# Patient Record
Sex: Male | Born: 1995 | Race: Black or African American | Hispanic: No | Marital: Single | Smoking: Current every day smoker
Health system: Southern US, Community
[De-identification: ages and names within clinical notes are randomized; demographics above are authoritative.]

## PROBLEM LIST (undated history)

## (undated) ENCOUNTER — Emergency Department (HOSPITAL_COMMUNITY): Payer: Medicaid Other | Source: Home / Self Care

## (undated) DIAGNOSIS — J45909 Unspecified asthma, uncomplicated: Secondary | ICD-10-CM

---
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
## 2017-06-25 ENCOUNTER — Other Ambulatory Visit: Payer: Self-pay

## 2017-06-25 ENCOUNTER — Encounter (HOSPITAL_BASED_OUTPATIENT_CLINIC_OR_DEPARTMENT_OTHER): Payer: Self-pay

## 2017-06-25 DIAGNOSIS — R197 Diarrhea, unspecified: Secondary | ICD-10-CM | POA: Insufficient documentation

## 2017-06-25 NOTE — ED Triage Notes (Signed)
Pt c/o an episode of diarrhea at 1700 with continued nausea since then, has been somewhat relieved by pepto bismol

## 2017-06-26 ENCOUNTER — Emergency Department (HOSPITAL_BASED_OUTPATIENT_CLINIC_OR_DEPARTMENT_OTHER)
Admission: EM | Admit: 2017-06-26 | Discharge: 2017-06-26 | Disposition: A | Discharge status: Home / Self Care | Payer: Medicaid Other | Attending: Emergency Medicine | Admitting: Emergency Medicine

## 2017-06-26 DIAGNOSIS — R197 Diarrhea, unspecified: Secondary | ICD-10-CM

## 2017-06-26 MED ORDER — LOPERAMIDE HCL 2 MG PO CAPS: 2.0000 mg | ORAL_CAPSULE | Freq: Four times a day (QID) | ORAL | 0 refills | Status: AC | PRN

## 2017-06-26 NOTE — ED Notes (Signed)
Pt had diarrhea x 1 at 1700 last pm  Nothing since

## 2017-06-26 NOTE — Discharge Instructions (Signed)
Please read and follow all provided instructions.  Your diagnoses today include:  1. Diarrhea in adult patient     Tests performed today include: Vital signs. See below for your results today.   Medications prescribed:  Take as prescribed   Home care instructions:  Follow any educational materials contained in this packet.  Follow-up instructions: Please follow-up with your primary care provider for further evaluation of symptoms and treatment   Return instructions:  Please return to the Emergency Department if you do not get better, if you get worse, or new symptoms OR  - Fever (temperature greater than 101.59F)  - Bleeding that does not stop with holding pressure to the area    -Severe pain (please note that you may be more sore the day after your accident)  - Chest Pain  - Difficulty breathing  - Severe nausea or vomiting  - Inability to tolerate food and liquids  - Passing out  - Skin becoming red around your wounds  - Change in mental status (confusion or lethargy)  - New numbness or weakness    Please return if you have any other emergent concerns.  Additional Information:  Your vital signs today were: BP (!) 142/84 (BP Location: Left Arm)    Pulse 78    Temp 99.9 F (37.7 C) (Oral)    Resp 18    Ht 5\' 9"  (1.753 m)    Wt 95.3 kg (210 lb)    SpO2 100%    BMI 31.01 kg/m  If your blood pressure (BP) was elevated above 135/85 this visit, please have this repeated by your doctor within one month. ---------------

## 2017-06-26 NOTE — ED Provider Notes (Signed)
[facility] EMERGENCY DEPARTMENT Provider Note   CSN: [id number] Arrival date & time: 06/25/17  2305     History   Chief Complaint Chief Complaint  Patient presents with  . Diarrhea    HPI Paul Reilly is a 21 y.o. male.  HPI  21 y.o. male with a hx of Asthma, presents to the Emergency Department today due to diarrhea. This occurred around 1700. Resolved afterwards. Noted nausea without emesis. This also resolved. No abdominal pain. States symptoms occurred after eating waffle house. No CP/SOB. No fevers. No headaches. Denies symptoms currently. Took peptobismol with improvement of symptoms. No other symptoms noted   Past Medical History:  Diagnosis Date  . Asthma     There are no active problems to display for this patient.   History reviewed. No pertinent surgical history.     Home Medications    Prior to Admission medications   Not on File    Family History No family history on file.  Social History Social History   Tobacco Use  . Smoking status: Never Smoker  Substance Use Topics  . Alcohol use: No  . Drug use: No     Allergies   Patient has no known allergies.   Review of Systems Review of Systems ROS reviewed and all are negative for acute change except as noted in the HPI.  Physical Exam Updated Vital Signs BP (!) 142/84 (BP Location: Left Arm)   Pulse 78   Temp 99.9 F (37.7 C) (Oral)   Resp 18   Ht 5\' 9"  (1.753 m)   Wt 95.3 kg (210 lb)   SpO2 100%   BMI 31.01 kg/m   Physical Exam  Constitutional: He is oriented to person, place, and time. He appears well-developed and well-nourished. No distress.  HENT:  Head: Normocephalic and atraumatic.  Right Ear: Tympanic membrane, external ear and ear canal normal.  Left Ear: Tympanic membrane, external ear and ear canal normal.  Nose: Nose normal.  Mouth/Throat: Uvula is midline, oropharynx is clear and moist and mucous membranes are normal. No trismus in the jaw. No  oropharyngeal exudate, posterior oropharyngeal erythema or tonsillar abscesses.  Eyes: EOM are normal. Pupils are equal, round, and reactive to light.  Neck: Normal range of motion. Neck supple. No tracheal deviation present.  Cardiovascular: Normal rate, regular rhythm, S1 normal, S2 normal, normal heart sounds, intact distal pulses and normal pulses.  Pulmonary/Chest: Effort normal and breath sounds normal. No respiratory distress. He has no decreased breath sounds. He has no wheezes. He has no rhonchi. He has no rales.  Abdominal: Normal appearance and bowel sounds are normal. There is no tenderness. There is no rigidity, no rebound, no guarding, no CVA tenderness, no tenderness at McBurney's point and negative Murphy's sign.  Abdomen soft  Musculoskeletal: Normal range of motion.  Neurological: He is alert and oriented to person, place, and time.  Skin: Skin is warm and dry.  Psychiatric: He has a normal mood and affect. His speech is normal and behavior is normal. Thought content normal.  Nursing note and vitals reviewed.    ED Treatments / Results  Labs (all labs ordered are listed, but only abnormal results are displayed) Labs Reviewed - No data to display  EKG  EKG Interpretation None       Radiology No results found.  Procedures Procedures (including critical care time)  Medications Ordered in ED Medications - No data to display   Initial Impression / Assessment and Plan /  ED Course  I have reviewed the triage vital signs and the nursing notes.  Pertinent labs & imaging results that were available during my care of the patient were reviewed by me and considered in my medical decision making (see chart for details).  Final Clinical Impressions(s) / ED Diagnoses     {I have reviewed the relevant previous healthcare records.  {I obtained HPI from historian.   ED Course:  Assessment: Pt is a 21 y.o. male  with a hx of Asthma, presents to the Emergency Department  today due to diarrhea. This occurred around 1700. Resolved afterwards. Noted nausea without emesis. This also resolved. No abdominal pain. States symptoms occurred after eating waffle house. No CP/SOB. No fevers. No headaches. Denies symptoms currently. Took peptobismol with improvement of symptoms. On exam, pt in NAD. Nontoxic/nonseptic appearing. VSS. Afebrile. Lungs CTA. Heart RRR. Abdomen nontender soft. Likely viral. No evidence of dehydration. Plan is to DC home with follow up to PCP. At time of discharge, Patient is in no acute distress. Vital Signs are stable. Patient is able to ambulate. Patient able to tolerate PO.   Disposition/Plan:  DC Home Additional Verbal discharge instructions given and discussed with patient.  Pt Instructed to f/u with PCP in the next week for evaluation and treatment of symptoms. Return precautions given Pt acknowledges and agrees with plan  Supervising Physician [place], Jonny RuizJohn, MD  Final diagnoses:  Diarrhea in adult patient    ED Discharge Orders    None       Audry PiliMohr, Sapna Padron, PA-C 06/26/17 [place] [id number]    Pfeiffer, Marcy, MD 06/27/17 [phone]

## 2017-11-09 ENCOUNTER — Encounter (HOSPITAL_BASED_OUTPATIENT_CLINIC_OR_DEPARTMENT_OTHER): Payer: Self-pay | Admitting: *Deleted

## 2017-11-09 ENCOUNTER — Emergency Department (HOSPITAL_BASED_OUTPATIENT_CLINIC_OR_DEPARTMENT_OTHER)
Admission: EM | Admit: 2017-11-09 | Discharge: 2017-11-09 | Disposition: A | Discharge status: Home / Self Care | Payer: Medicaid Other | Attending: Emergency Medicine | Admitting: Emergency Medicine

## 2017-11-09 ENCOUNTER — Other Ambulatory Visit: Payer: Self-pay

## 2017-11-09 ENCOUNTER — Emergency Department (HOSPITAL_BASED_OUTPATIENT_CLINIC_OR_DEPARTMENT_OTHER): Payer: Medicaid Other

## 2017-11-09 DIAGNOSIS — R05 Cough: Secondary | ICD-10-CM | POA: Diagnosis present

## 2017-11-09 DIAGNOSIS — R062 Wheezing: Secondary | ICD-10-CM | POA: Diagnosis not present

## 2017-11-09 DIAGNOSIS — J45901 Unspecified asthma with (acute) exacerbation: Secondary | ICD-10-CM | POA: Diagnosis not present

## 2017-11-09 DIAGNOSIS — J45909 Unspecified asthma, uncomplicated: Secondary | ICD-10-CM | POA: Insufficient documentation

## 2017-11-09 DIAGNOSIS — J069 Acute upper respiratory infection, unspecified: Secondary | ICD-10-CM | POA: Diagnosis not present

## 2017-11-09 DIAGNOSIS — B9789 Other viral agents as the cause of diseases classified elsewhere: Secondary | ICD-10-CM | POA: Diagnosis not present

## 2017-11-09 DIAGNOSIS — R0981 Nasal congestion: Secondary | ICD-10-CM | POA: Diagnosis not present

## 2017-11-09 MED ORDER — PREDNISONE 50 MG PO TABS
60.0000 mg | ORAL_TABLET | Freq: Once | ORAL | Status: AC
  Administered 2017-11-09: 60 mg via ORAL
  Filled 2017-11-09: qty 1

## 2017-11-09 MED ORDER — ALBUTEROL SULFATE HFA 108 (90 BASE) MCG/ACT IN AERS
2.0000 | INHALATION_SPRAY | Freq: Once | RESPIRATORY_TRACT | Status: AC
  Administered 2017-11-09: 2 via RESPIRATORY_TRACT
  Filled 2017-11-09: qty 6.7

## 2017-11-09 MED ORDER — PREDNISONE 20 MG PO TABS: 60.0000 mg | ORAL_TABLET | Freq: Every day | ORAL | 0 refills | Status: AC

## 2017-11-09 MED ORDER — ALBUTEROL SULFATE (2.5 MG/3ML) 0.083% IN NEBU
5.0000 mg | INHALATION_SOLUTION | Freq: Once | RESPIRATORY_TRACT | Status: AC
Start: 2017-11-09 — End: 2017-11-09
  Administered 2017-11-09: 5 mg via RESPIRATORY_TRACT
  Filled 2017-11-09: qty 6

## 2017-11-09 NOTE — ED Notes (Signed)
Pt given d/c instructions as per chart. Rx x 1. Verbalizes understanding. No questions. 

## 2017-11-09 NOTE — ED Triage Notes (Signed)
Pt states he had to walk out in the rain the other day d/t having a flat tire and has been congested since. Also c/o NP cough. BBS-few scattered rhonchi.

## 2017-11-09 NOTE — ED Notes (Signed)
ED Provider at bedside. 

## 2017-11-09 NOTE — ED Provider Notes (Signed)
TIME SEEN: 5:00 AM  CHIEF COMPLAINT: Cough, congestion  HPI: Patient is a 22 year old male with history of asthma who presents to the emergency department with complaints of nonproductive cough, nasal congestion for the past several days.  Has had intermittent mild wheezing.  No fever.  No vomiting or diarrhea.  Multiple sick contacts.  ROS: See HPI Constitutional: no fever  Eyes: no drainage  ENT: no runny nose   Cardiovascular:  no chest pain  Resp: no SOB  GI: no vomiting GU: no dysuria Integumentary: no rash  Allergy: no hives  Musculoskeletal: no leg swelling  Neurological: no slurred speech ROS otherwise negative  PAST MEDICAL HISTORY/PAST SURGICAL HISTORY:  Past Medical History:  Diagnosis Date  . Asthma     MEDICATIONS:  Prior to Admission medications   Medication Sig Start Date End Date Taking? Authorizing Provider  ALBUTEROL IN Inhale into the lungs.   Yes [provider]  loperamide (IMODIUM) 2 MG capsule Take 1 capsule (2 mg total) by mouth 4 (four) times daily as needed for diarrhea or loose stools. 06/26/17   Audry Pili, PA-C    ALLERGIES:  No Known Allergies  SOCIAL HISTORY:  Social History   Tobacco Use  . Smoking status: Never Smoker  . Smokeless tobacco: Never Used  Substance Use Topics  . Alcohol use: No    FAMILY HISTORY: History reviewed. No pertinent family history.  EXAM: BP 120/75 (BP Location: Right Arm)   Pulse 90   Temp 98.7 F (37.1 C) (Oral)   Resp 18   Ht  (1.753 m)   Wt 90.7 kg (200 lb)   SpO2 95%   BMI 29.53 kg/m  CONSTITUTIONAL: Alert and oriented and responds appropriately to questions. Well-appearing; well-nourished HEAD: Normocephalic EYES: Conjunctivae clear, pupils appear equal, EOMI ENT: normal nose; moist mucous membranes NECK: Supple, no meningismus, no nuchal rigidity, no LAD  CARD: RRR; S1 and S2 appreciated; no murmurs, no clicks, no rubs, no gallops RESP: Normal chest excursion without  splinting or tachypnea; breath sounds equal bilaterally but there are mild scattered expiratory wheezes, no rhonchi, no rales, no hypoxia or respiratory distress, speaking full sentences ABD/GI: Normal bowel sounds; non-distended; soft, non-tender, no rebound, no guarding, no peritoneal signs, no hepatosplenomegaly BACK:  The back appears normal and is non-tender to palpation, there is no CVA tenderness EXT: Normal ROM in all joints; non-tender to palpation; no edema; normal capillary refill; no cyanosis, no calf tenderness or swelling    SKIN: Normal color for age and race; warm; no rash NEURO: Moves all extremities equally PSYCH: The patient's mood and manner are appropriate. Grooming and personal hygiene are appropriate.  MEDICAL DECISION MAKING: Patient here with likely viral illness, asthma exacerbation.  Will treat symptomatically with albuterol and prednisone.  Will provide with inhaler from the emergency department.  Will obtain chest x-ray to rule out pneumonia.  Doubt pneumothorax.  Does not appear volume overloaded.  ED PROGRESS: Lungs now clear after albuterol treatment.  Reports feeling much better.  Chest x-ray clear with no sign of pneumonia, edema.  Patient does not need to be on antibiotics at this time.  Provided with albuterol inhaler and instructions on how to use it at home.  Will discharge with prednisone burst.  He states he has a primary care physician for follow-up.  Discussed return precautions.   At this time, I do not feel there is any life-threatening condition present. I have reviewed and discussed all results (EKG, imaging, lab, urine  as appropriate) and exam findings with patient/family. I have reviewed nursing notes and appropriate previous records.  I feel the patient is safe to be discharged home without further emergent workup and can continue workup as an outpatient as needed. Discussed usual and customary return precautions. Patient/family verbalize understanding  and are comfortable with this plan.  Outpatient follow-up has been provided if needed. All questions have been answered.      Dorsey Charette, Layla Maw, DO 11/09/17 [phone]

## 2017-11-09 NOTE — ED Notes (Signed)
Pt triaged by this RN 

## 2017-11-09 NOTE — Discharge Instructions (Addendum)
You may use your albuterol inhaler 2 to 4 puffs every 2-4 hours as needed for shortness of breath, wheezing.  You may use over-the-counter Mucinex as needed for cough.  If you develop fever or pain you may alternate between Tylenol 1000 mg every 6 hours and ibuprofen 800 mg every 8 hours.  I recommend that you take ibuprofen with food.  Your chest x-ray today was normal.  You have no sign of pneumonia.  You do not need to be on antibiotics.

## 2019-03-25 ENCOUNTER — Encounter (HOSPITAL_BASED_OUTPATIENT_CLINIC_OR_DEPARTMENT_OTHER): Payer: Self-pay

## 2019-03-25 ENCOUNTER — Other Ambulatory Visit: Payer: Self-pay

## 2019-03-25 ENCOUNTER — Emergency Department (HOSPITAL_BASED_OUTPATIENT_CLINIC_OR_DEPARTMENT_OTHER)
Admission: EM | Admit: 2019-03-25 | Discharge: 2019-03-25 | Disposition: A | Discharge status: Home / Self Care | Payer: Medicaid Other | Attending: Emergency Medicine | Admitting: Emergency Medicine

## 2019-03-25 ENCOUNTER — Emergency Department (HOSPITAL_BASED_OUTPATIENT_CLINIC_OR_DEPARTMENT_OTHER): Payer: Medicaid Other

## 2019-03-25 DIAGNOSIS — J069 Acute upper respiratory infection, unspecified: Secondary | ICD-10-CM

## 2019-03-25 DIAGNOSIS — F17290 Nicotine dependence, other tobacco product, uncomplicated: Secondary | ICD-10-CM | POA: Insufficient documentation

## 2019-03-25 DIAGNOSIS — J45909 Unspecified asthma, uncomplicated: Secondary | ICD-10-CM | POA: Insufficient documentation

## 2019-03-25 DIAGNOSIS — Z20828 Contact with and (suspected) exposure to other viral communicable diseases: Secondary | ICD-10-CM | POA: Insufficient documentation

## 2019-03-25 DIAGNOSIS — Z20822 Contact with and (suspected) exposure to covid-19: Secondary | ICD-10-CM

## 2019-03-25 DIAGNOSIS — Z79899 Other long term (current) drug therapy: Secondary | ICD-10-CM | POA: Insufficient documentation

## 2019-03-25 NOTE — Discharge Instructions (Signed)
You were seen in the emergency department for 2 days of cough sore throat and low-grade fever.  Your chest x-ray did not show any signs of pneumonia.  This is possibly Covid and so you should isolate until the results of your test are back and your symptoms are improved.  Please follow-up with your doctor return if any worsening symptoms.

## 2019-03-25 NOTE — ED Provider Notes (Signed)
[place] EMERGENCY DEPARTMENT Provider Note   CSN: [id number] Arrival date & time: 03/25/19  1124     History   Chief Complaint Chief Complaint  Patient presents with  . Cough    HPI Paul Reilly is a 23 y.o. male.  He is complaining of 2 days of nasal congestion and a nonproductive cough with a little bit of sore throat.  He had a low-grade fever of 99.1.  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  He was told he cannot return until his testing is negative.  He said his grandfather had Covid but he was not in contact with him.  Otherwise no sick contacts.  No recent travel.     The history is provided by the patient.  Cough Cough characteristics:  Non-productive Severity:  Moderate Onset quality:  Sudden Duration:  2 days Timing:  Intermittent Progression:  Unchanged Chronicity:  New Smoker: yes   Relieved by:  None tried Worsened by:  Nothing Ineffective treatments:  None tried Associated symptoms: fever, rhinorrhea and sore throat   Associated symptoms: no chest pain, no chills, no ear pain, no headaches, no myalgias, no rash, no shortness of breath, no sinus congestion, no weight loss and no wheezing     Past Medical History:  Diagnosis Date  . Asthma     There are no active problems to display for this patient.   History reviewed. No pertinent surgical history.      Home Medications    Prior to Admission medications   Medication Sig Start Date End Date Taking? Authorizing Provider  ALBUTEROL IN Inhale into the lungs.    [provider]  loperamide (IMODIUM) 2 MG capsule Take 1 capsule (2 mg total) by mouth 4 (four) times daily as needed for diarrhea or loose stools. 06/26/17   Shary Decamp, PA-C  predniSONE (DELTASONE) 20 MG tablet Take 3 tablets (60 mg total) by mouth daily. Start the morning of 11/10/17 11/09/17   Ward, Delice Bison, DO    Family History No family history on file.  Social History Social  History   Tobacco Use  . Smoking status: Current Every Day Smoker    Types: Cigars  . Smokeless tobacco: Never Used  Substance Use Topics  . Alcohol use: Yes    Comment: occ  . Drug use: No     Allergies   Other and Shrimp [shellfish allergy]   Review of Systems Review of Systems  Constitutional: Positive for fever. Negative for chills and weight loss.  HENT: Positive for rhinorrhea and sore throat. Negative for ear pain.   Eyes: Negative for visual disturbance.  Respiratory: Positive for cough. Negative for shortness of breath and wheezing.   Cardiovascular: Negative for chest pain.  Gastrointestinal: Negative for abdominal pain.  Genitourinary: Negative for dysuria.  Musculoskeletal: Negative for myalgias.  Skin: Negative for rash.  Neurological: Negative for headaches.     Physical Exam Updated Vital Signs BP 130/87 (BP Location: Left Arm)   Pulse 92   Temp 99.2 F (37.3 C) (Oral)   Resp 16   Ht 5\' 9"  (1.753 m)   Wt 83.9 kg   SpO2 100%   BMI 27.32 kg/m   Physical Exam Vitals signs and nursing note reviewed.  Constitutional:      Appearance: He is well-developed.  HENT:     Head: Normocephalic and atraumatic.     Right Ear: Tympanic membrane normal.  Left Ear: Tympanic membrane normal.     Mouth/Throat:     Mouth: Mucous membranes are moist.     Pharynx: Oropharynx is clear.  Eyes:     Conjunctiva/sclera: Conjunctivae normal.  Neck:     Musculoskeletal: Neck supple.  Cardiovascular:     Rate and Rhythm: Normal rate and regular rhythm.     Heart sounds: No murmur.  Pulmonary:     Effort: Pulmonary effort is normal. No respiratory distress.     Breath sounds: Normal breath sounds.  Abdominal:     Palpations: Abdomen is soft.     Tenderness: There is no abdominal tenderness.  Skin:    General: Skin is warm and dry.     Capillary Refill: Capillary refill takes less than 2 seconds.  Neurological:     General: No focal deficit present.      Mental Status: He is alert.      ED Treatments / Results  Labs (all labs ordered are listed, but only abnormal results are displayed) Labs Reviewed  NOVEL CORONAVIRUS, NAA (HOSP ORDER, SEND-OUT TO REF LAB; TAT 18-24 HRS)    EKG None  Radiology Dg Chest Port 1 View  Result Date: 03/25/2019 CLINICAL DATA:  Cough and runny nose for 2 days. EXAM: PORTABLE CHEST 1 VIEW COMPARISON:  PA and lateral chest 11/09/2017. FINDINGS: Lungs clear. Heart size normal. No pneumothorax or pleural fluid. No bony abnormality. IMPRESSION: Normal chest. Electronically Signed   By: Drusilla Kanner M.D.   On: 03/25/2019 12:58    Procedures Procedures (including critical care time)  Medications Ordered in ED Medications - No data to display   Initial Impression / Assessment and Plan / ED Course  I have reviewed the triage vital signs and the nursing notes.  Pertinent labs & imaging results that were available during my care of the patient were reviewed by me and considered in my medical decision making (see chart for details).  Clinical Course as of Mar 25 1639  Thu Mar 25, 2019  1305 Patient is here with 2 days of upper respiratory infection symptoms.  Sent home from work due to [employer] concerns.  Chest x-ray does not show any infiltrates.  Low-grade fever normal pulse ox here.  We will do send a Covid test and discharge with plan for isolation at home.  Return instructions given.   [MB]    Clinical Course User Index [MB] Terrilee Files, MD   Paul Reilly was evaluated in Emergency Department on 03/25/2019 for the symptoms described in the history of present illness. He was evaluated in the context of the global COVID-19 pandemic, which necessitated consideration that the patient might be at risk for infection with the SARS-CoV-2 virus that causes COVID-19. Institutional protocols and algorithms that pertain to the evaluation of patients at risk for COVID-19 are in a state of rapid change based  on information released by regulatory bodies including the CDC and federal and state organizations. These policies and algorithms were followed during the patient's care in the ED.      Final Clinical Impressions(s) / ED Diagnoses   Final diagnoses:  Viral URI with cough  Suspected Covid-19 Virus Infection    ED Discharge Orders    None       Terrilee Files, MD 03/25/19 1640

## 2019-03-25 NOTE — ED Notes (Signed)
Nose stopped up, head congestion,  Slight cough x 2 days

## 2019-03-25 NOTE — ED Triage Notes (Signed)
C/o flu like sx day 2-NAD-steady gait 

## 2019-03-26 LAB — NOVEL CORONAVIRUS, NAA (HOSP ORDER, SEND-OUT TO REF LAB; TAT 18-24 HRS): SARS-CoV-2, NAA: NOT DETECTED

## 2019-09-19 ENCOUNTER — Other Ambulatory Visit: Payer: Self-pay

## 2019-09-19 ENCOUNTER — Encounter (HOSPITAL_BASED_OUTPATIENT_CLINIC_OR_DEPARTMENT_OTHER): Payer: Self-pay

## 2019-09-19 ENCOUNTER — Emergency Department (HOSPITAL_BASED_OUTPATIENT_CLINIC_OR_DEPARTMENT_OTHER)
Admission: EM | Admit: 2019-09-19 | Discharge: 2019-09-19 | Disposition: A | Discharge status: Home / Self Care | Payer: Medicaid Other | Attending: Emergency Medicine | Admitting: Emergency Medicine

## 2019-09-19 DIAGNOSIS — J45909 Unspecified asthma, uncomplicated: Secondary | ICD-10-CM | POA: Insufficient documentation

## 2019-09-19 DIAGNOSIS — F1210 Cannabis abuse, uncomplicated: Secondary | ICD-10-CM | POA: Insufficient documentation

## 2019-09-19 DIAGNOSIS — R0602 Shortness of breath: Secondary | ICD-10-CM | POA: Insufficient documentation

## 2019-09-19 DIAGNOSIS — Z20822 Contact with and (suspected) exposure to covid-19: Secondary | ICD-10-CM | POA: Insufficient documentation

## 2019-09-19 DIAGNOSIS — F17290 Nicotine dependence, other tobacco product, uncomplicated: Secondary | ICD-10-CM | POA: Insufficient documentation

## 2019-09-19 DIAGNOSIS — J029 Acute pharyngitis, unspecified: Secondary | ICD-10-CM

## 2019-09-19 LAB — GROUP A STREP BY PCR: Group A Strep by PCR: NOT DETECTED

## 2019-09-19 LAB — SARS CORONAVIRUS 2 AG (30 MIN TAT): SARS Coronavirus 2 Ag: NEGATIVE

## 2019-09-19 MED ORDER — PREDNISONE 20 MG PO TABS: 20.0000 mg | ORAL_TABLET | Freq: Every day | ORAL | 0 refills | Status: AC

## 2019-09-19 MED ORDER — DEXAMETHASONE SODIUM PHOSPHATE 10 MG/ML IJ SOLN
10.0000 mg | Freq: Once | INTRAMUSCULAR | Status: AC
  Administered 2019-09-19: 10 mg via INTRAMUSCULAR
  Filled 2019-09-19: qty 1

## 2019-09-19 MED ORDER — LIDOCAINE VISCOUS HCL 2 % MT SOLN
15.0000 mL | Freq: Once | OROMUCOSAL | Status: AC
  Administered 2019-09-19: 15 mL via OROMUCOSAL
  Filled 2019-09-19: qty 15

## 2019-09-19 MED ORDER — NAPROXEN 500 MG PO TABS: 500.0000 mg | ORAL_TABLET | Freq: Two times a day (BID) | ORAL | 0 refills | Status: AC

## 2019-09-19 MED ORDER — KETOROLAC TROMETHAMINE 30 MG/ML IJ SOLN
30.0000 mg | Freq: Once | INTRAMUSCULAR | Status: AC
  Administered 2019-09-19: 30 mg via INTRAMUSCULAR
  Filled 2019-09-19: qty 1

## 2019-09-19 NOTE — ED Provider Notes (Signed)
[facility] EMERGENCY DEPARTMENT Provider Note   CSN: [id number] Arrival date & time: 09/19/19  1141     History Chief Complaint  Patient presents with  . Sore Throat    Paul Reilly is a 24 y.o. male with a past medical history significant for asthma who presents to the ED due to sore throat that started earlier this morning associated with mild shortness of breath.  Patient notes he feels like his tonsils are swollen.  He admits to intermittent difficulty swallowing and changes to phonation.  He has gargled salt water with moderate relief.  Rates his pain a 5/10.  Denies fever and chills.  Denies sick contacts and Covid exposures.  Mother is at bedside.  Patient is currently up-to-date with all of his vaccines. Denies cough, abdominal pain, headache, nausea, vomiting, and diarrhea. Denies oral sexual intercourse.   History obtained from patient and past medical records. No interpreter used during encounter.      Past Medical History:  Diagnosis Date  . Asthma     There are no problems to display for this patient.   History reviewed. No pertinent surgical history.     No family history on file.  Social History   Tobacco Use  . Smoking status: Current Every Day Smoker    Types: Cigars  . Smokeless tobacco: Never Used  Substance Use Topics  . Alcohol use: Yes    Comment: occ  . Drug use: Yes    Types: Marijuana    Home Medications Prior to Admission medications   Medication Sig Start Date End Date Taking? Authorizing Provider  ALBUTEROL IN Inhale into the lungs.    [provider]  loperamide (IMODIUM) 2 MG capsule Take 1 capsule (2 mg total) by mouth 4 (four) times daily as needed for diarrhea or loose stools. 06/26/17   Audry Pili, PA-C  naproxen (NAPROSYN) 500 MG tablet Take 1 tablet (500 mg total) by mouth 2 (two) times daily. 09/19/19   Mannie Stabile, PA-C  predniSONE (DELTASONE) 20 MG tablet Take 3 tablets (60 mg total) by mouth  daily. Start the morning of 11/10/17 11/09/17   Ward, Layla Maw, DO  predniSONE (DELTASONE) 20 MG tablet Take 1 tablet (20 mg total) by mouth daily for 5 days. 09/19/19 09/24/19  Mannie Stabile, PA-C    Allergies    Other and Shrimp [shellfish allergy]  Review of Systems   Review of Systems  Constitutional: Negative for chills and fever.  HENT: Positive for sore throat, trouble swallowing and voice change. Negative for congestion, drooling, rhinorrhea and sinus pressure.   Respiratory: Positive for shortness of breath. Negative for cough.   Cardiovascular: Negative for chest pain and leg swelling.  Gastrointestinal: Negative for abdominal pain, diarrhea, nausea and vomiting.  Neurological: Negative for headaches.  All other systems reviewed and are negative.   Physical Exam Updated Vital Signs BP (!) 142/86 (BP Location: Right Arm)   Pulse 79   Temp 98.4 F (36.9 C) (Oral)   Resp 18   Ht 5\' 8"  (1.727 m)   Wt 86.2 kg   SpO2 100%   BMI 28.89 kg/m   Physical Exam Vitals and nursing note reviewed.  Constitutional:      General: He is not in acute distress.    Appearance: He is not toxic-appearing.  HENT:     Head: Normocephalic.     Mouth/Throat:     Comments: Posterior oropharynx clear and mucous membranes moist, there is mild  erythema with edematous uvula.  Uvula midline. 2+ tonsillar hypertrophy bilaterally.  Mild muffled phonation.  No trismus.  Tolerating oral secretions without difficulty.  No exudates. Eyes:     Pupils: Pupils are equal, round, and reactive to light.  Neck:     Comments: No meningismus. Cardiovascular:     Rate and Rhythm: Normal rate and regular rhythm.     Pulses: Normal pulses.     Heart sounds: Normal heart sounds. No murmur. No friction rub. No gallop.   Pulmonary:     Effort: Pulmonary effort is normal.     Breath sounds: Normal breath sounds.  Abdominal:     General: Abdomen is flat. Bowel sounds are normal. There is no distension.      Palpations: Abdomen is soft.     Tenderness: There is no abdominal tenderness. There is no guarding or rebound.  Musculoskeletal:     Cervical back: Neck supple.     Comments: Able to move all 4 extremities without difficulty.  Skin:    General: Skin is warm and dry.  Neurological:     General: No focal deficit present.     Mental Status: He is alert.  Psychiatric:        Mood and Affect: Mood normal.        Behavior: Behavior normal.     ED Results / Procedures / Treatments   Labs (all labs ordered are listed, but only abnormal results are displayed) Labs Reviewed  GROUP A STREP BY PCR  SARS CORONAVIRUS 2 AG (30 MIN TAT)  SARS CORONAVIRUS 2 (TAT 6-24 HRS)    EKG None  Radiology No results found.  Procedures Procedures (including critical care time)  Medications Ordered in ED Medications  dexamethasone (DECADRON) injection 10 mg (10 mg Intramuscular Given 09/19/19 1247)  ketorolac (TORADOL) 30 MG/ML injection 30 mg (30 mg Intramuscular Given 09/19/19 1247)  lidocaine (XYLOCAINE) 2 % viscous mouth solution 15 mL (15 mLs Mouth/Throat Given 09/19/19 1239)    ED Course  I have reviewed the triage vital signs and the nursing notes.  Pertinent labs & imaging results that were available during my care of the patient were reviewed by me and considered in my medical decision making (see chart for details).  Clinical Course as of Sep 19 1338  Sun Sep 19, 2019  1336 SARS Coronavirus 2 Ag: NEGATIVE [CA]  1336 Group A Strep by PCR: NOT DETECTED [CA]    Clinical Course User Index [CA] Mannie Stabile, PA-C   MDM Rules/Calculators/A&P                     24 year old male presents to the ED due to sore throat that started this morning associated with changes to phonation and difficulty swallowing.  Denies fever, cough.  No sick contacts or Covid exposures.  Afebrile, not tachycardic or hypoxic. Posterior oropharynx clear and mucous membranes moist, there is mild erythema with  edematous uvula.  Uvula midline. 2+ tonsillar hypertrophy bilaterally.  Mild muffled phonation.  No trismus.  Tolerating oral secretions without difficulty.  No exudates. No meningismus. Doubt meningitis. Will obtain strep and Covid test.  Also give Toradol, Decadron, and viscous lidocaine and reassess patient.  Upon reassessment patient notes improved in symptoms after decadron and Toradol. Strep and rapid Covid test negative. Covid PCR test pending. Will discharge patient with prednisone and naproxen. Patient given ENT number at discharge and instructed to follow-up with them early next week for further evaluation. Discussed  case with Dr. Roslynn Amble who evaluated patient at bedside and agrees with assessment and plan. Strict ED precautions discussed with patient. Patient states understanding and agrees to plan. Patient discharged home in no acute distress and stable vitals. Final Clinical Impression(s) / ED Diagnoses Final diagnoses:  Pharyngitis, unspecified etiology    Rx / DC Orders ED Discharge Orders         Ordered    naproxen (NAPROSYN) 500 MG tablet  2 times daily     09/19/19 1339    predniSONE (DELTASONE) 20 MG tablet  Daily     09/19/19 1339           Karie Kirks 09/19/19 1349    Lucrezia Starch, MD 09/20/19 [phone]

## 2019-09-19 NOTE — Discharge Instructions (Addendum)
As discussed, your strep test and rapid Covid test were negative. I have sent for an outpatient Covid test. Continue to self quarantine until Covid results become available. I am sending you home with prednisone and naproxen. Take as prescribed. I have also included the number for the ENT doctor. Call Monday to schedule an appointment for further evaluation. Return to the ER if you develop difficulties breathing or worsening of symptoms.

## 2019-09-19 NOTE — ED Triage Notes (Signed)
Pt arrives to ED with c/o sore throat starting with waking this morning, also reports some SOB. Throat is red and uvula swollen. NAD. VSS.

## 2019-09-20 LAB — SARS CORONAVIRUS 2 (TAT 6-24 HRS): SARS Coronavirus 2: NEGATIVE

## 2020-06-14 ENCOUNTER — Encounter (HOSPITAL_BASED_OUTPATIENT_CLINIC_OR_DEPARTMENT_OTHER): Payer: Self-pay

## 2020-06-14 ENCOUNTER — Other Ambulatory Visit: Payer: Self-pay

## 2020-06-14 ENCOUNTER — Emergency Department (HOSPITAL_BASED_OUTPATIENT_CLINIC_OR_DEPARTMENT_OTHER)
Admission: EM | Admit: 2020-06-14 | Discharge: 2020-06-14 | Disposition: A | Discharge status: Home / Self Care | Payer: Medicaid Other | Attending: Emergency Medicine | Admitting: Emergency Medicine

## 2020-06-14 DIAGNOSIS — R519 Headache, unspecified: Secondary | ICD-10-CM | POA: Insufficient documentation

## 2020-06-14 DIAGNOSIS — M436 Torticollis: Secondary | ICD-10-CM | POA: Insufficient documentation

## 2020-06-14 DIAGNOSIS — J45909 Unspecified asthma, uncomplicated: Secondary | ICD-10-CM | POA: Insufficient documentation

## 2020-06-14 DIAGNOSIS — F17290 Nicotine dependence, other tobacco product, uncomplicated: Secondary | ICD-10-CM | POA: Insufficient documentation

## 2020-06-14 NOTE — Discharge Instructions (Addendum)
I have provided a referral for neurology, please schedule an appointment at your earliest convenience.   You may continue taking tylenol or ibuprofen to help with your headaches.

## 2020-06-14 NOTE — ED Triage Notes (Signed)
Pt states he has been having headaches daily, resolve on their own. States he thinks he "has a brain tumor because he is on [employer]". Denies blurred vision, dizziness, weakness.

## 2020-06-14 NOTE — ED Provider Notes (Signed)
[facility] EMERGENCY DEPARTMENT Provider Note   CSN: [id number] Arrival date & time: 06/14/20  1831     History Chief Complaint  Patient presents with  . Headache    Paul Reilly is a 24 y.o. male.  24 y.o male with no PMH presents to the ED with a chief complaint of headache x several weeks.Patient describes it as generalized relieve with tylenol and aleve. He reports being under "a lot of stress", states he is working two jobs and sleeps around 3-4 hours a day.He also states going on google today and reading about how he could have a "brain tumor". He also admits to marijuana use prior to arrival in the ED which he reports "has been messing with my anxiety". He also states sleeping on his couch causing him to have some neck stiffness.He denies any fever,vomiting, changes in vision, or any family hx of anerysum.   The history is provided by the patient.  Headache Pain location:  Generalized Quality:  Unable to specify Radiates to:  Does not radiate Onset quality:  Gradual Duration:  2 weeks Timing:  Intermittent Progression:  Unchanged Chronicity:  Recurrent Context: emotional stress   Relieved by:  NSAIDs and acetaminophen Worsened by:  Nothing Associated symptoms: neck stiffness   Associated symptoms: no blurred vision, no dizziness, no fever, no loss of balance and no myalgias   Risk factors: no family hx of SAH and does not have insomnia        Past Medical History:  Diagnosis Date  . Asthma     There are no problems to display for this patient.   History reviewed. No pertinent surgical history.     History reviewed. No pertinent family history.  Social History   Tobacco Use  . Smoking status: Current Every Day Smoker    Types: Cigars  . Smokeless tobacco: Never Used  . Tobacco comment: rolls marijuana in cigar  Vaping Use  . Vaping Use: Never used  Substance Use Topics  . Alcohol use: Yes    Comment: almost daily  . Drug use: Yes     Types: Marijuana    Comment: daily    Home Medications Prior to Admission medications   Medication Sig Start Date End Date Taking? Authorizing Provider  ALBUTEROL IN Inhale into the lungs.    [provider]  loperamide (IMODIUM) 2 MG capsule Take 1 capsule (2 mg total) by mouth 4 (four) times daily as needed for diarrhea or loose stools. 06/26/17   Audry Pili, PA-C  naproxen (NAPROSYN) 500 MG tablet Take 1 tablet (500 mg total) by mouth 2 (two) times daily. 09/19/19   Mannie Stabile, PA-C  predniSONE (DELTASONE) 20 MG tablet Take 3 tablets (60 mg total) by mouth daily. Start the morning of 11/10/17 11/09/17   Ward, Layla Maw, DO    Allergies    Other and Shrimp [shellfish allergy]  Review of Systems   Review of Systems  Constitutional: Negative for fever.  Eyes: Negative for blurred vision.  Musculoskeletal: Positive for neck stiffness. Negative for myalgias.  Neurological: Positive for headaches. Negative for dizziness and loss of balance.    Physical Exam Updated Vital Signs BP (!) 147/96 (BP Location: Right Arm)   Pulse 85   Temp 99.6 F (37.6 C) (Oral)   Resp 16   Ht 5\' 9"  (1.753 m)   Wt 90.7 kg   SpO2 100%   BMI 29.53 kg/m   Physical Exam Vitals and nursing note  reviewed.  Constitutional:      Appearance: He is well-developed and well-nourished. He is not ill-appearing or toxic-appearing.  HENT:     Head: Normocephalic and atraumatic.     Mouth/Throat:     Mouth: Oropharynx is clear and moist.  Eyes:     General: No scleral icterus.    Pupils: Pupils are equal, round, and reactive to light.     Comments: Pupils are dilated but equal.  Cardiovascular:     Heart sounds: Normal heart sounds.  Pulmonary:     Effort: Pulmonary effort is normal.     Breath sounds: Normal breath sounds. No wheezing.  Chest:     Chest wall: No tenderness.  Abdominal:     General: Bowel sounds are normal. There is no distension.     Palpations: Abdomen is soft.      Tenderness: There is no abdominal tenderness.  Musculoskeletal:        General: No tenderness or deformity.     Cervical back: Normal range of motion.  Skin:    General: Skin is warm and dry.  Neurological:     Mental Status: He is alert and oriented to person, place, and time.     Comments: Alert, oriented, thought content appropriate. Speech fluent without evidence of aphasia. Able to follow 2 step commands without difficulty.  Cranial Nerves:  II:  Peripheral visual fields grossly normal, pupils, round, reactive to light III,IV, VI: ptosis not present, extra-ocular motions intact bilaterally  V,VII: smile symmetric, facial light touch sensation equal VIII: hearing grossly normal bilaterally  IX,X: midline uvula rise  XI: bilateral shoulder shrug equal and strong XII: midline tongue extension  Motor:  5/5 in upper and lower extremities bilaterally including strong and equal grip strength and dorsiflexion/plantar flexion Sensory: light touch normal in all extremities.  Cerebellar: normal finger-to-nose with bilateral upper extremities, pronator drift negative Gait: normal gait and balance   Psychiatric:        Mood and Affect: Mood is anxious.     ED Results / Procedures / Treatments   Labs (all labs ordered are listed, but only abnormal results are displayed) Labs Reviewed - No data to display  EKG None  Radiology No results found.  Procedures Procedures (including critical care time)  Medications Ordered in ED Medications - No data to display  ED Course  I have reviewed the triage vital signs and the nursing notes.  Pertinent labs & imaging results that were available during my care of the patient were reviewed by me and considered in my medical decision making (see chart for details).    MDM Rules/Calculators/A&P    Patient  With no PMH presents here with headache x several weeks. Generalized relieved with OTC medication. Vitals are within normal  limits, neuro exam is benign, does endorse marijuana use prior to arrival in the ED. Pupils are dilated, and he is ambulatory with a steady gate. No neck rigidity no fever, lower suspicion for meningitis. He reports "googlin and getting anxious that I have a brain tumor". Patient was offered a CT however we discussed appropriate use of imaging along with risk of radiation. We discussed his stress levels and lack of sleep might be a factor on his symptoms. No prior hx of aneurysm, no family hx of aneurysm.   I recommended OTC therapy along with sleep, I encourage patient to make a neurology should he feel his headaches were not improving after adequate rest. He is agreeable of plan at  this time. Patient discharge in stable condition.   Portions of this note were generated with Scientist, clinical (histocompatibility and immunogenetics). Dictation errors may occur despite best attempts at proofreading.  Final Clinical Impression(s) / ED Diagnoses Final diagnoses:  Bad headache    Rx / DC Orders ED Discharge Orders    None       Claude Manges, PA-C 06/14/20 1935    Melene Plan, DO 06/14/20 2055

## 2020-10-19 ENCOUNTER — Emergency Department (HOSPITAL_BASED_OUTPATIENT_CLINIC_OR_DEPARTMENT_OTHER): Payer: Self-pay

## 2020-10-19 ENCOUNTER — Other Ambulatory Visit: Payer: Self-pay

## 2020-10-19 ENCOUNTER — Emergency Department (HOSPITAL_BASED_OUTPATIENT_CLINIC_OR_DEPARTMENT_OTHER)
Admission: EM | Admit: 2020-10-19 | Discharge: 2020-10-19 | Disposition: A | Discharge status: Home / Self Care | Payer: Self-pay | Attending: Emergency Medicine | Admitting: Emergency Medicine

## 2020-10-19 ENCOUNTER — Encounter (HOSPITAL_BASED_OUTPATIENT_CLINIC_OR_DEPARTMENT_OTHER): Payer: Self-pay

## 2020-10-19 DIAGNOSIS — X501XXA Overexertion from prolonged static or awkward postures, initial encounter: Secondary | ICD-10-CM | POA: Insufficient documentation

## 2020-10-19 DIAGNOSIS — S93401A Sprain of unspecified ligament of right ankle, initial encounter: Secondary | ICD-10-CM | POA: Insufficient documentation

## 2020-10-19 DIAGNOSIS — Y9289 Other specified places as the place of occurrence of the external cause: Secondary | ICD-10-CM | POA: Insufficient documentation

## 2020-10-19 DIAGNOSIS — Y990 Civilian activity done for income or pay: Secondary | ICD-10-CM | POA: Insufficient documentation

## 2020-10-19 DIAGNOSIS — F17290 Nicotine dependence, other tobacco product, uncomplicated: Secondary | ICD-10-CM | POA: Insufficient documentation

## 2020-10-19 DIAGNOSIS — J45909 Unspecified asthma, uncomplicated: Secondary | ICD-10-CM | POA: Insufficient documentation

## 2020-10-19 NOTE — ED Triage Notes (Signed)
Pt states he injured left ankle/foot at work yesterday-NAD-steady gait

## 2020-10-19 NOTE — ED Notes (Signed)
Patient transported to X-ray 

## 2020-10-19 NOTE — ED Notes (Signed)
Patient states stepped wrong way onto left ankle at work twisting it.  States unable to put weight on it and has difficulty flexing foot upwards

## 2020-10-19 NOTE — ED Provider Notes (Signed)
[facility] EMERGENCY DEPARTMENT Provider Note   CSN: [id number] Arrival date & time: 10/19/20  1155     History Chief Complaint  Patient presents with  . Ankle Injury    Paul Reilly is a 25 y.o. male.  The history is provided by the patient.  Ankle Injury This is a new problem. The current episode started yesterday. The symptoms are aggravated by walking. Nothing relieves the symptoms. He has tried nothing for the symptoms. The treatment provided no relief.       Past Medical History:  Diagnosis Date  . Asthma     There are no problems to display for this patient.   History reviewed. No pertinent surgical history.     No family history on file.  Social History   Tobacco Use  . Smoking status: Current Every Day Smoker    Types: Cigars  . Smokeless tobacco: Never Used  Vaping Use  . Vaping Use: Never used  Substance Use Topics  . Alcohol use: Yes    Comment: daly  . Drug use: Yes    Types: Marijuana    Home Medications Prior to Admission medications   Medication Sig Start Date End Date Taking? Authorizing Provider  ALBUTEROL IN Inhale into the lungs.    [provider]  loperamide (IMODIUM) 2 MG capsule Take 1 capsule (2 mg total) by mouth 4 (four) times daily as needed for diarrhea or loose stools. 06/26/17   Audry Pili, PA-C  naproxen (NAPROSYN) 500 MG tablet Take 1 tablet (500 mg total) by mouth 2 (two) times daily. 09/19/19   Mannie Stabile, PA-C  predniSONE (DELTASONE) 20 MG tablet Take 3 tablets (60 mg total) by mouth daily. Start the morning of 11/10/17 11/09/17   Ward, Layla Maw, DO    Allergies    Other and Shrimp [shellfish allergy]  Review of Systems   Review of Systems  Constitutional: Negative for fever.  Musculoskeletal: Positive for arthralgias and gait problem. Negative for joint swelling and neck stiffness.  Skin: Negative for color change, pallor, rash and wound.  Neurological: Negative for weakness and  numbness.    Physical Exam Updated Vital Signs  ED Triage Vitals  Enc Vitals Group     BP 10/19/20 1203 (!) 157/95     Pulse Rate 10/19/20 1203 83     Resp 10/19/20 1203 18     Temp 10/19/20 1203 98.7 F (37.1 C)     Temp Source 10/19/20 1203 Oral     SpO2 10/19/20 1203 100 %     Weight 10/19/20 1205 185 lb (83.9 kg)     Height 10/19/20 1205 5\' 8"  (1.727 m)     Head Circumference --      Peak Flow --      Pain Score 10/19/20 1203 5     Pain Loc --      Pain Edu? --      Excl. in GC? --     Physical Exam Constitutional:      General: He is not in acute distress.    Appearance: He is not ill-appearing.  Cardiovascular:     Pulses: Normal pulses.  Musculoskeletal:        General: Tenderness (TTP to left foot and ankle) present. No swelling or deformity. Normal range of motion.  Skin:    Capillary Refill: Capillary refill takes less than 2 seconds.  Neurological:     Mental Status: He is alert.     Sensory:  No sensory deficit.     Motor: No weakness.     ED Results / Procedures / Treatments   Labs (all labs ordered are listed, but only abnormal results are displayed) Labs Reviewed - No data to display  EKG None  Radiology No results found.  Procedures Procedures   Medications Ordered in ED Medications - No data to display  ED Course  I have reviewed the triage vital signs and the nursing notes.  Pertinent labs & imaging results that were available during my care of the patient were reviewed by me and considered in my medical decision making (see chart for details).    MDM Rules/Calculators/A&P                          Marlowe Alt is here with left ankle and foot pain.  Rolled it last night.  Neurovascularly neuromuscularly intact.  X-ray showed no fracture or malalignment.  Overall suspect mild sprain.  No major swelling.  Recommend ice, Tylenol, Motrin.  Placed in ankle Aircast for support with crutches.  Weightbearing as tolerated.  Discharged in  good condition.  This chart was dictated using voice recognition software.  Despite best efforts to proofread,  errors can occur which can change the documentation meaning.    Final Clinical Impression(s) / ED Diagnoses Final diagnoses:  Sprain of right ankle, unspecified ligament, initial encounter    Rx / DC Orders ED Discharge Orders    None       Virgina Norfolk, DO 10/19/20 1223

## 2020-10-19 NOTE — Discharge Instructions (Addendum)
X-rays are negative for fracture.  Overall suspect you have a mild sprain of your ankle and foot.  Recommend ice, Tylenol, Motrin.  Use crutches and Aircast as needed for support.  Weight-bear as tolerated.  Follow-up with your primary care doctor if pain persist.

## 2020-10-19 NOTE — ED Notes (Signed)
Patient back from x-ray 

## 2020-12-08 IMAGING — DX DG CHEST 1V PORT
1 series · 1 of 1 positions shown · non-contrast
Comparison: PA and lateral chest 11/09/2017.

CLINICAL DATA: Cough and runny nose for 2 days.

EXAM:
PORTABLE CHEST 1 VIEW

[chest ap]
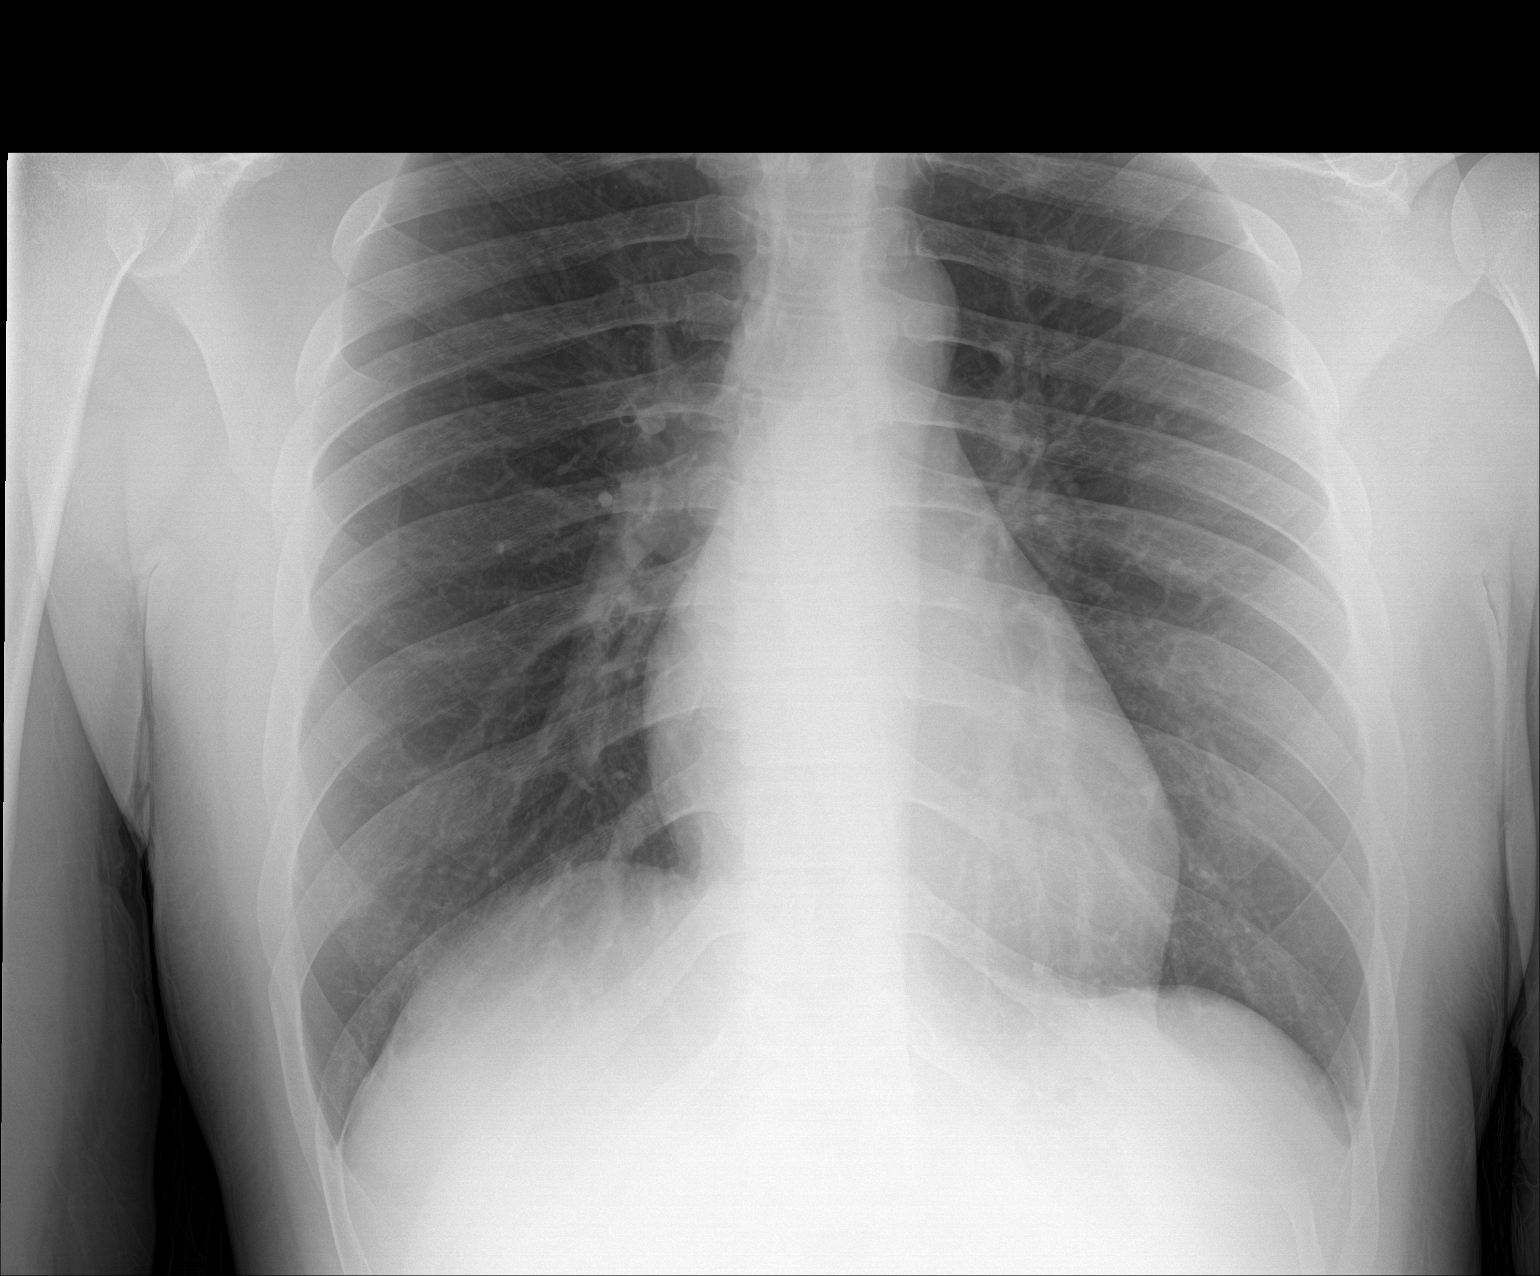

[1 of 1 positions shown; findings below may reference images not displayed]

FINDINGS: Lungs clear. Heart size normal. No pneumothorax or pleural fluid. No
bony abnormality.
IMPRESSION: Normal chest.

## 2020-12-27 ENCOUNTER — Other Ambulatory Visit: Payer: Self-pay

## 2020-12-27 ENCOUNTER — Encounter (HOSPITAL_BASED_OUTPATIENT_CLINIC_OR_DEPARTMENT_OTHER): Payer: Self-pay

## 2020-12-27 ENCOUNTER — Emergency Department (HOSPITAL_BASED_OUTPATIENT_CLINIC_OR_DEPARTMENT_OTHER)
Admission: EM | Admit: 2020-12-27 | Discharge: 2020-12-27 | Disposition: A | Discharge status: Home / Self Care | Payer: Medicaid Other | Attending: Emergency Medicine | Admitting: Emergency Medicine

## 2020-12-27 DIAGNOSIS — L538 Other specified erythematous conditions: Secondary | ICD-10-CM

## 2020-12-27 DIAGNOSIS — F17290 Nicotine dependence, other tobacco product, uncomplicated: Secondary | ICD-10-CM | POA: Insufficient documentation

## 2020-12-27 DIAGNOSIS — J45909 Unspecified asthma, uncomplicated: Secondary | ICD-10-CM | POA: Insufficient documentation

## 2020-12-27 MED ORDER — AMOXICILLIN 500 MG PO CAPS: 500.0000 mg | ORAL_CAPSULE | Freq: Two times a day (BID) | ORAL | 0 refills | Status: AC

## 2020-12-27 NOTE — ED Provider Notes (Signed)
[facility] EMERGENCY DEPARTMENT Provider Note   CSN: [id number] Arrival date & time: 12/27/20  1610     History Chief Complaint  Patient presents with   Rash    Paul Reilly is a 25 y.o. male.  The history is provided by the patient.  Rash Paul LUIZ is a 25 y.o. male who presents to the Emergency Department complaining of rash. The emergency department complaining of rash that is generalized to his body for the last two days. He did have a sore throat one week ago. He sees distal little bit red but less sore now. No reports of fevers, difficulty swallowing, difficulty breathing, cough, nausea, vomiting, diarrhea. He has no known medical problems and takes no medications. The rash is not itchy or painful. He states that started around his neck and then spread throughout his body. 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    Past Medical History:  Diagnosis Date   Asthma     There are no problems to display for this patient.   History reviewed. No pertinent surgical history.     No family history on file.  Social History   Tobacco Use   Smoking status: Every Day    Pack years: 0.00    Types: Cigars   Smokeless tobacco: Never  Vaping Use   Vaping Use: Never used  Substance Use Topics   Alcohol use: Yes    Comment: daily   Drug use: Yes    Types: Marijuana    Home Medications Prior to Admission medications   Medication Sig Start Date End Date Taking? Authorizing Provider  amoxicillin (AMOXIL) 500 MG capsule Take 1 capsule (500 mg total) by mouth 2 (two) times daily. 12/27/20  Yes Tilden Fossa, MD  ALBUTEROL IN Inhale into the lungs.    [provider]  loperamide (IMODIUM) 2 MG capsule Take 1 capsule (2 mg total) by mouth 4 (four) times daily as needed for diarrhea or loose stools. 06/26/17   Audry Pili, PA-C  naproxen (NAPROSYN) 500 MG tablet Take 1 tablet (500 mg total) by mouth 2 (two) times daily. 09/19/19   Mannie Stabile, PA-C   predniSONE (DELTASONE) 20 MG tablet Take 3 tablets (60 mg total) by mouth daily. Start the morning of 11/10/17 11/09/17   Ward, Layla Maw, DO    Allergies    Other and Shrimp [shellfish allergy]  Review of Systems   Review of Systems  Skin:  Positive for rash.  All other systems reviewed and are negative.  Physical Exam Updated Vital Signs BP 126/88 (BP Location: Left Arm)   Pulse (!) 103   Temp 99.2 F (37.3 C) (Oral)   Resp 18   Ht 5\' 8"  (1.727 m)   Wt 88 kg   SpO2 100%   BMI 29.50 kg/m   Physical Exam Vitals and nursing note reviewed.  Constitutional:      Appearance: He is well-developed.  HENT:     Head: Normocephalic and atraumatic.     Comments: Erythema and mild tonsillar edema with exudates  Cardiovascular:     Rate and Rhythm: Normal rate and regular rhythm.     Heart sounds: No murmur heard. Pulmonary:     Effort: Pulmonary effort is normal. No respiratory distress.     Breath sounds: Normal breath sounds.  Abdominal:     Palpations: Abdomen is soft.     Tenderness: There is no abdominal tenderness. There is no guarding or rebound.  Musculoskeletal:  General: No tenderness.  Skin:    General: Skin is warm and dry.     Comments: There is a generalized maculopapular sandpaper textured rash to trunk, arms, legs.   Neurological:     Mental Status: He is alert and oriented to person, place, and time.  Psychiatric:        Behavior: Behavior normal.    ED Results / Procedures / Treatments   Labs (all labs ordered are listed, but only abnormal results are displayed) Labs Reviewed - No data to display  EKG None  Radiology No results found.  Procedures Procedures   Medications Ordered in ED Medications - No data to display  ED Course  I have reviewed the triage vital signs and the nursing notes.  Pertinent labs & imaging results that were available during my care of the patient were reviewed by me and considered in my medical decision  making (see chart for details).    MDM Rules/Calculators/A&P                         patient here for evaluation of rash. Rashes consistent with streptococcal infection. He is non-toxic appearing on evaluation, presentation is not consistent with sepsis, SJS. Discussed with patient home care for rash, strep pharyngitis. Discussed outpatient follow-up and return precautions.   Final Clinical Impression(s) / ED Diagnoses Final diagnoses:  Scarlatiniform rash    Rx / DC Orders ED Discharge Orders          Ordered    amoxicillin (AMOXIL) 500 MG capsule  2 times daily        12/27/20 1659             Tilden Fossa, MD 12/27/20 1702

## 2020-12-27 NOTE — ED Triage Notes (Signed)
Pt c/o scattered rash x 2 days-denies fever-NAD-steady gait

## 2022-06-26 ENCOUNTER — Other Ambulatory Visit: Payer: Self-pay

## 2022-06-26 ENCOUNTER — Emergency Department (HOSPITAL_BASED_OUTPATIENT_CLINIC_OR_DEPARTMENT_OTHER)
Admission: EM | Admit: 2022-06-26 | Discharge: 2022-06-26 | Discharge status: Against Medical Advice | Payer: Medicaid Other | Attending: Emergency Medicine | Admitting: Emergency Medicine

## 2022-06-26 ENCOUNTER — Encounter (HOSPITAL_BASED_OUTPATIENT_CLINIC_OR_DEPARTMENT_OTHER): Payer: Self-pay | Admitting: Emergency Medicine

## 2022-06-26 DIAGNOSIS — Z1152 Encounter for screening for COVID-19: Secondary | ICD-10-CM | POA: Insufficient documentation

## 2022-06-26 DIAGNOSIS — Z5321 Procedure and treatment not carried out due to patient leaving prior to being seen by health care provider: Secondary | ICD-10-CM | POA: Insufficient documentation

## 2022-06-26 DIAGNOSIS — R110 Nausea: Secondary | ICD-10-CM | POA: Insufficient documentation

## 2022-06-26 DIAGNOSIS — J029 Acute pharyngitis, unspecified: Secondary | ICD-10-CM | POA: Insufficient documentation

## 2022-06-26 LAB — RESP PANEL BY RT-PCR (RSV, FLU A&B, COVID)  RVPGX2
Influenza A by PCR: NEGATIVE
Influenza B by PCR: NEGATIVE
Resp Syncytial Virus by PCR: NEGATIVE
SARS Coronavirus 2 by RT PCR: NEGATIVE

## 2022-06-26 NOTE — ED Notes (Signed)
Called for room x 3, pt not visualized in lobby

## 2022-06-26 NOTE — ED Notes (Signed)
Called pt to be roomed, No answer.

## 2022-06-26 NOTE — ED Triage Notes (Signed)
Patient presents C/O nausea X2 weeks. Recent sore throat. Denies sick contacts.

## 2022-06-27 ENCOUNTER — Emergency Department (HOSPITAL_BASED_OUTPATIENT_CLINIC_OR_DEPARTMENT_OTHER)
Admission: EM | Admit: 2022-06-27 | Discharge: 2022-06-27 | Disposition: A | Discharge status: Home / Self Care | Payer: Medicaid Other | Attending: Emergency Medicine | Admitting: Emergency Medicine

## 2022-06-27 ENCOUNTER — Encounter (HOSPITAL_BASED_OUTPATIENT_CLINIC_OR_DEPARTMENT_OTHER): Payer: Self-pay

## 2022-06-27 ENCOUNTER — Other Ambulatory Visit: Payer: Self-pay

## 2022-06-27 DIAGNOSIS — R110 Nausea: Secondary | ICD-10-CM | POA: Insufficient documentation

## 2022-06-27 LAB — CBC
HCT: 42.9 % (ref 39.0–52.0)
Hemoglobin: 13.8 g/dL (ref 13.0–17.0)
MCH: 28.7 pg (ref 26.0–34.0)
MCHC: 32.2 g/dL (ref 30.0–36.0)
MCV: 89.2 fL (ref 80.0–100.0)
Platelets: 268 10*3/uL (ref 150–400)
RBC: 4.81 MIL/uL (ref 4.22–5.81)
RDW: 12.4 % (ref 11.5–15.5)
WBC: 7.9 10*3/uL (ref 4.0–10.5)
nRBC: 0 % (ref 0.0–0.2)

## 2022-06-27 LAB — COMPREHENSIVE METABOLIC PANEL
ALT: 19 U/L (ref 0–44)
AST: 20 U/L (ref 15–41)
Albumin: 3.7 g/dL (ref 3.5–5.0)
Alkaline Phosphatase: 48 U/L (ref 38–126)
Anion gap: 7 (ref 5–15)
BUN: 14 mg/dL (ref 6–20)
CO2: 25 mmol/L (ref 22–32)
Calcium: 9.2 mg/dL (ref 8.9–10.3)
Chloride: 106 mmol/L (ref 98–111)
Creatinine, Ser: 0.97 mg/dL (ref 0.61–1.24)
GFR, Estimated: >60 mL/min (ref >60)
Glucose, Bld: 114 mg/dL — ABNORMAL HIGH (ref 70–99)
Potassium: 3.8 mmol/L (ref 3.5–5.1)
Sodium: 138 mmol/L (ref 135–145)
Total Bilirubin: 0.2 mg/dL — ABNORMAL LOW (ref 0.3–1.2)
Total Protein: 7.3 g/dL (ref 6.5–8.1)

## 2022-06-27 MED ORDER — ONDANSETRON 4 MG PO TBDP
8.0000 mg | ORAL_TABLET | Freq: Once | ORAL | Status: AC
  Administered 2022-06-27: 8 mg via ORAL
  Filled 2022-06-27: qty 2

## 2022-06-27 MED ORDER — ONDANSETRON 8 MG PO TBDP: 8.0000 mg | ORAL_TABLET | Freq: Three times a day (TID) | ORAL | 0 refills | Status: AC | PRN

## 2022-06-27 MED ORDER — PANTOPRAZOLE SODIUM 40 MG PO TBEC: 40.0000 mg | DELAYED_RELEASE_TABLET | Freq: Every day | ORAL | 0 refills | Status: AC

## 2022-06-27 NOTE — ED Notes (Signed)
Reviewed discharge and recommendations with pt. States understanding. Work excuse provided

## 2022-06-27 NOTE — Discharge Instructions (Signed)
Your blood test today in the ED were normal.  Refrain from alcohol and marijuana use to see if that helps with the nausea symptoms.  Take the antacid medication and antinausea medication as prescribed.  Follow-up with a primary care doctor or GI doctor for further evaluation symptoms do not resolve in the next couple of weeks.  Return to the ER for fevers abdominal pain worsening symptoms

## 2022-06-27 NOTE — ED Provider Notes (Signed)
[facility] EMERGENCY DEPARTMENT Provider Note   CSN: [id number] Arrival date & time: 06/27/22  3154     History  Chief Complaint  Patient presents with   Nausea    Paul Reilly is a 26 y.o. male.  HPI   Patient presents ED for evaluation of persistent nausea for the last couple of weeks.  Patient states he has been feeling nauseated but has not had any episodes of vomiting.  The nausea is persistent waxing and waning but does not follow a particular pattern.  It does not always occur with feeding.  He has been able to eat and drink without difficulty he has not had any diarrhea.  NO urinary symptoms.  No abdominal pain.  Patient was concerned by the duration, he does not have a primary care doctor so he came to the ED  Home Medications Prior to Admission medications   Medication Sig Start Date End Date Taking? Authorizing Provider  ondansetron (ZOFRAN-ODT) 8 MG disintegrating tablet Take 1 tablet (8 mg total) by mouth every 8 (eight) hours as needed for nausea or vomiting. 06/27/22  Yes Linwood Dibbles, MD  pantoprazole (PROTONIX) 40 MG tablet Take 1 tablet (40 mg total) by mouth daily. 06/27/22  Yes Linwood Dibbles, MD  ALBUTEROL IN Inhale into the lungs.    [provider]  amoxicillin (AMOXIL) 500 MG capsule Take 1 capsule (500 mg total) by mouth 2 (two) times daily. 12/27/20   Tilden Fossa, MD  loperamide (IMODIUM) 2 MG capsule Take 1 capsule (2 mg total) by mouth 4 (four) times daily as needed for diarrhea or loose stools. 06/26/17   Audry Pili, PA-C  naproxen (NAPROSYN) 500 MG tablet Take 1 tablet (500 mg total) by mouth 2 (two) times daily. 09/19/19   Mannie Stabile, PA-C  predniSONE (DELTASONE) 20 MG tablet Take 3 tablets (60 mg total) by mouth daily. Start the morning of 11/10/17 11/09/17   Ward, Layla Maw, DO      Allergies    Other and Shrimp [shellfish allergy]    Review of Systems   Review of Systems  Physical Exam Updated Vital Signs BP  133/87 (BP Location: Left Arm)   Pulse 89   Temp 98 F (36.7 C) (Oral)   Resp 18   Ht 1.727 m (5\' 8" )   Wt 90.7 kg   SpO2 97%   BMI 30.41 kg/m  Physical Exam Vitals and nursing note reviewed.  Constitutional:      General: He is not in acute distress.    Appearance: He is well-developed.  HENT:     Head: Normocephalic and atraumatic.     Right Ear: External ear normal.     Left Ear: External ear normal.  Eyes:     General: No scleral icterus.       Right eye: No discharge.        Left eye: No discharge.     Conjunctiva/sclera: Conjunctivae normal.  Neck:     Trachea: No tracheal deviation.  Cardiovascular:     Rate and Rhythm: Normal rate and regular rhythm.  Pulmonary:     Effort: Pulmonary effort is normal. No respiratory distress.     Breath sounds: Normal breath sounds. No stridor. No wheezing or rales.  Abdominal:     General: Bowel sounds are normal. There is no distension.     Palpations: Abdomen is soft.     Tenderness: There is no abdominal tenderness. There is no guarding or rebound.  Musculoskeletal:        General: No tenderness or deformity.     Cervical back: Neck supple.  Skin:    General: Skin is warm and dry.     Findings: No rash.  Neurological:     General: No focal deficit present.     Mental Status: He is alert.     Cranial Nerves: No cranial nerve deficit, dysarthria or facial asymmetry.     Sensory: No sensory deficit.     Motor: No abnormal muscle tone or seizure activity.     Coordination: Coordination normal.  Psychiatric:        Mood and Affect: Mood normal.     ED Results / Procedures / Treatments   Labs (all labs ordered are listed, but only abnormal results are displayed) Labs Reviewed  COMPREHENSIVE METABOLIC PANEL - Abnormal; Notable for the following components:      Result Value   Glucose, Bld 114 (*)    Total Bilirubin 0.2 (*)    All other components within normal limits  CBC    EKG None  Radiology No results  found.  Procedures Procedures    Medications Ordered in ED Medications  ondansetron (ZOFRAN-ODT) disintegrating tablet 8 mg (8 mg Oral Given 06/27/22 0849)    ED Course/ Medical Decision Making/ A&P Clinical Course as of 06/27/22 0955  Thu Jun 27, 2022  0944 CBC CBC normal [JK]  0945 Comprehensive metabolic panel(!) Metabolic panel normal [JK]    Clinical Course User Index [JK] Linwood Dibbles, MD                           Medical Decision Making Problems Addressed: Nausea: acute illness or injury that poses a threat to life or bodily functions  Amount and/or Complexity of Data Reviewed Labs: ordered. Decision-making details documented in ED Course.  Risk Prescription drug management.  Patient presented to the ED for evaluation of nausea without pain or vomiting.  Patient does admit to daily alcohol and marijuana use.  His ED workup is reassuring.  CBC and metabolic panel unremarkable.  No signs of hepatitis.  No signs of acute kidney dysfunction.  No anemia.  Possible his symptoms could be related to gastritis from his alcohol use.  Marijuana may also be inducing his nausea.  Discussed cessation of his alcohol and marijuana.  Outpatient follow-up with PCP.  Prescribed antacids and Zofran for symptomatic relief.        Final Clinical Impression(s) / ED Diagnoses Final diagnoses:  Nausea    Rx / DC Orders ED Discharge Orders          Ordered    pantoprazole (PROTONIX) 40 MG tablet  Daily        06/27/22 0953    ondansetron (ZOFRAN-ODT) 8 MG disintegrating tablet  Every 8 hours PRN        06/27/22 0953              Linwood Dibbles, MD 06/27/22 [phone]

## 2022-06-27 NOTE — ED Triage Notes (Signed)
Nausea x 2 weeks, denies abdominal pain, vomiting, diarrhea.

## 2022-07-05 IMAGING — CR DG ANKLE COMPLETE 3+V*L*
3 series · 3 of 3 positions shown · non-contrast
Comparison: None.

CLINICAL DATA: Twisting injury yesterday with medial pain

EXAM:
LEFT ANKLE COMPLETE - 3+ VIEW

[t ankle joint ap left]
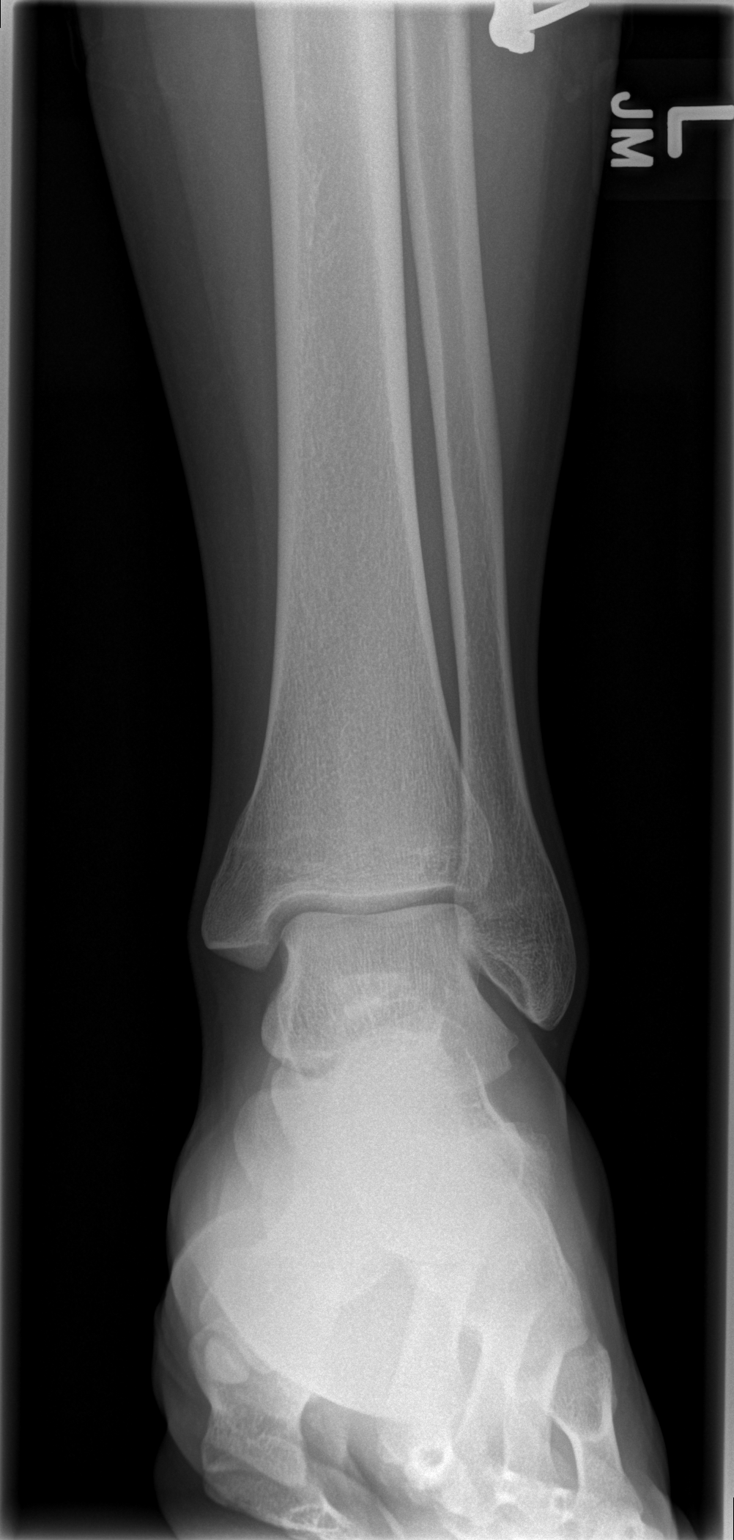

[t ankle joint oblique left]
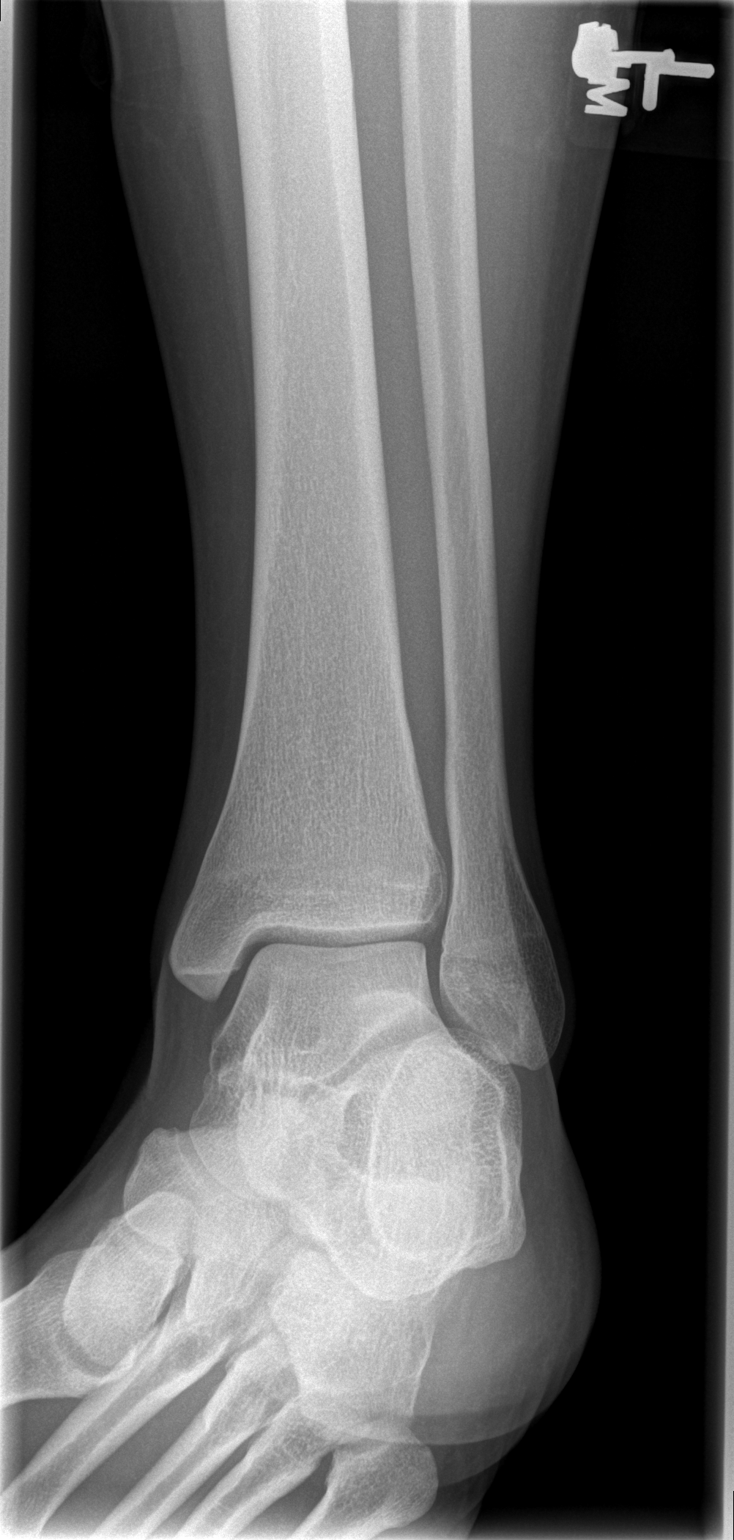

[t ankle joint lat left]
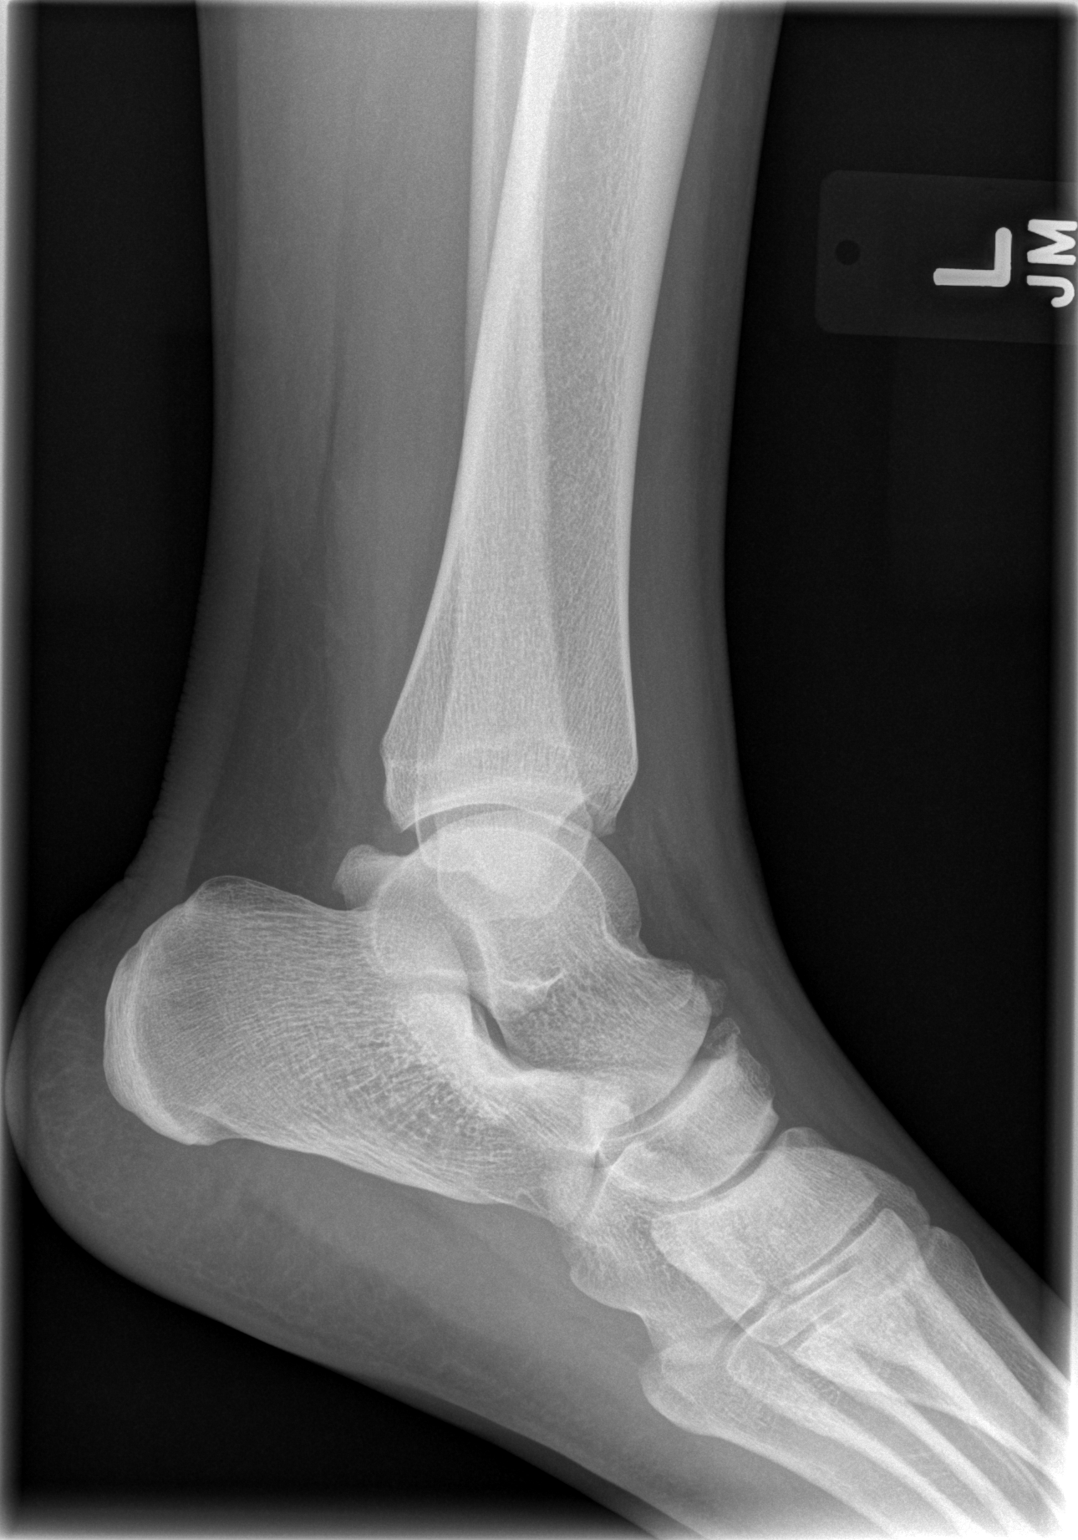

[3 of 3 positions shown; findings below may reference images not displayed]

FINDINGS: No evidence of acute fracture or dislocation. Chronic corticated
bone at the talonavicular articulation could relate to distant
injury.
IMPRESSION: No acute finding. Chronic corticated bone at the talonavicular
articulation could relate to distant injury.

## 2022-07-11 ENCOUNTER — Encounter (HOSPITAL_BASED_OUTPATIENT_CLINIC_OR_DEPARTMENT_OTHER): Payer: Self-pay | Admitting: Pediatrics

## 2022-07-11 ENCOUNTER — Other Ambulatory Visit: Payer: Self-pay

## 2022-07-11 ENCOUNTER — Emergency Department (HOSPITAL_BASED_OUTPATIENT_CLINIC_OR_DEPARTMENT_OTHER)
Admission: EM | Admit: 2022-07-11 | Discharge: 2022-07-11 | Disposition: A | Discharge status: Home / Self Care | Payer: Medicaid Other | Attending: Emergency Medicine | Admitting: Emergency Medicine

## 2022-07-11 DIAGNOSIS — F1290 Cannabis use, unspecified, uncomplicated: Secondary | ICD-10-CM

## 2022-07-11 DIAGNOSIS — R110 Nausea: Secondary | ICD-10-CM

## 2022-07-11 LAB — URINALYSIS, ROUTINE W REFLEX MICROSCOPIC
Bilirubin Urine: NEGATIVE
Glucose, UA: NEGATIVE mg/dL
Hgb urine dipstick: NEGATIVE
Ketones, ur: NEGATIVE mg/dL
Leukocytes,Ua: NEGATIVE
Nitrite: NEGATIVE
Protein, ur: NEGATIVE mg/dL
Specific Gravity, Urine: 1.025 (ref 1.005–1.030)
pH: 6.5 (ref 5.0–8.0)

## 2022-07-11 LAB — CBC
HCT: 44.9 % (ref 39.0–52.0)
Hemoglobin: 14.2 g/dL (ref 13.0–17.0)
MCH: 28.2 pg (ref 26.0–34.0)
MCHC: 31.6 g/dL (ref 30.0–36.0)
MCV: 89.3 fL (ref 80.0–100.0)
Platelets: 256 10*3/uL (ref 150–400)
RBC: 5.03 MIL/uL (ref 4.22–5.81)
RDW: 12.4 % (ref 11.5–15.5)
WBC: 8.3 10*3/uL (ref 4.0–10.5)
nRBC: 0 % (ref 0.0–0.2)

## 2022-07-11 LAB — COMPREHENSIVE METABOLIC PANEL
ALT: 21 U/L (ref 0–44)
AST: 27 U/L (ref 15–41)
Albumin: 4.1 g/dL (ref 3.5–5.0)
Alkaline Phosphatase: 49 U/L (ref 38–126)
Anion gap: 10 (ref 5–15)
BUN: 13 mg/dL (ref 6–20)
CO2: 24 mmol/L (ref 22–32)
Calcium: 9.1 mg/dL (ref 8.9–10.3)
Chloride: 103 mmol/L (ref 98–111)
Creatinine, Ser: 0.9 mg/dL (ref 0.61–1.24)
GFR, Estimated: >60 mL/min (ref >60)
Glucose, Bld: 100 mg/dL — ABNORMAL HIGH (ref 70–99)
Potassium: 3.6 mmol/L (ref 3.5–5.1)
Sodium: 137 mmol/L (ref 135–145)
Total Bilirubin: 0.6 mg/dL (ref 0.3–1.2)
Total Protein: 7.8 g/dL (ref 6.5–8.1)

## 2022-07-11 LAB — LIPASE, BLOOD: Lipase: 27 U/L (ref 11–51)

## 2022-07-11 MED ORDER — METOCLOPRAMIDE HCL 10 MG PO TABS: 10.0000 mg | ORAL_TABLET | Freq: Three times a day (TID) | ORAL | 0 refills | Status: AC | PRN

## 2022-07-11 NOTE — ED Provider Notes (Signed)
[facility] Provider Note   CSN: [id number] Arrival date & time: 07/11/22  1144     History  Chief Complaint  Patient presents with   Abdominal Pain    Paul Reilly is a 27 y.o. male.  Patient presents with intermittent abdominal discomfort epigastric and primarily concern for intermittent nausea and waves for the last 2 weeks.  Patient had similar before the new year and was seen had blood work was unremarkable.  Patient was given medications however cannot afford it.  Normal bowel movements no urinary symptoms.  No abdominal surgery history or known ulcers or gallstones.  No association with fatty meals.       Home Medications Prior to Admission medications   Medication Sig Start Date End Date Taking? Authorizing Provider  metoCLOPramide (REGLAN) 10 MG tablet Take 1 tablet (10 mg total) by mouth every 8 (eight) hours as needed for nausea. 07/11/22  Yes Elnora Morrison, MD  ALBUTEROL IN Inhale into the lungs.    [provider]  amoxicillin (AMOXIL) 500 MG capsule Take 1 capsule (500 mg total) by mouth 2 (two) times daily. 12/27/20   Quintella Reichert, MD  loperamide (IMODIUM) 2 MG capsule Take 1 capsule (2 mg total) by mouth 4 (four) times daily as needed for diarrhea or loose stools. 06/26/17   Shary Decamp, PA-C  naproxen (NAPROSYN) 500 MG tablet Take 1 tablet (500 mg total) by mouth 2 (two) times daily. 09/19/19   Suzy Bouchard, PA-C  ondansetron (ZOFRAN-ODT) 8 MG disintegrating tablet Take 1 tablet (8 mg total) by mouth every 8 (eight) hours as needed for nausea or vomiting. 06/27/22   Dorie Rank, MD  pantoprazole (PROTONIX) 40 MG tablet Take 1 tablet (40 mg total) by mouth daily. 06/27/22   Dorie Rank, MD  predniSONE (DELTASONE) 20 MG tablet Take 3 tablets (60 mg total) by mouth daily. Start the morning of 11/10/17 11/09/17   Ward, Delice Bison, DO      Allergies    Other and Shrimp [shellfish allergy]    Review of Systems   Review  of Systems  Constitutional:  Negative for chills and fever.  HENT:  Negative for congestion.   Eyes:  Negative for visual disturbance.  Respiratory:  Negative for shortness of breath.   Cardiovascular:  Negative for chest pain.  Gastrointestinal:  Positive for nausea and vomiting. Negative for abdominal pain.  Genitourinary:  Negative for dysuria and flank pain.  Musculoskeletal:  Negative for back pain, neck pain and neck stiffness.  Skin:  Negative for rash.  Neurological:  Negative for light-headedness and headaches.    Physical Exam Updated Vital Signs BP 136/71   Pulse 76   Temp 98.2 F (36.8 C)   Resp 16   Ht 5\' 8"  (1.727 m)   Wt 90.7 kg   SpO2 100%   BMI 30.41 kg/m  Physical Exam Vitals and nursing note reviewed.  Constitutional:      General: He is not in acute distress.    Appearance: He is well-developed.  HENT:     Head: Normocephalic and atraumatic.     Mouth/Throat:     Mouth: Mucous membranes are moist.  Eyes:     General:        Right eye: No discharge.        Left eye: No discharge.     Conjunctiva/sclera: Conjunctivae normal.  Neck:     Trachea: No tracheal deviation.  Cardiovascular:     Rate  and Rhythm: Normal rate and regular rhythm.     Heart sounds: No murmur heard. Pulmonary:     Effort: Pulmonary effort is normal.     Breath sounds: Normal breath sounds.  Abdominal:     General: There is no distension.     Palpations: Abdomen is soft.     Tenderness: There is no abdominal tenderness. There is no guarding.  Musculoskeletal:     Cervical back: Normal range of motion and neck supple. No rigidity.  Skin:    General: Skin is warm.     Capillary Refill: Capillary refill takes less than 2 seconds.     Findings: No rash.  Neurological:     General: No focal deficit present.     Mental Status: He is alert.     Cranial Nerves: No cranial nerve deficit.  Psychiatric:        Mood and Affect: Mood normal.     ED Results / Procedures /  Treatments   Labs (all labs ordered are listed, but only abnormal results are displayed) Labs Reviewed  COMPREHENSIVE METABOLIC PANEL - Abnormal; Notable for the following components:      Result Value   Glucose, Bld 100 (*)    All other components within normal limits  CBC  URINALYSIS, ROUTINE W REFLEX MICROSCOPIC  LIPASE, BLOOD    EKG None  Radiology No results found.  Procedures Procedures    Medications Ordered in ED Medications - No data to display  ED Course/ Medical Decision Making/ A&P                           Medical Decision Making Amount and/or Complexity of Data Reviewed Labs: ordered.  Risk Prescription drug management.   Patient presents with intermittent nausea for multiple weeks differential includes cyclical nausea/vomiting from marijuana, viral/toxin mediated, reflux, ulcer, gallstones less likely with no right upper quadrant tenderness or pain, other.  Plan for blood work to check liver function, electrolytes, white blood cell count and hemoglobin.  Discussed importance of follow-up with gastroenterology for further delineation.  Discussed Reglan as needed and to try reflux medicines until he is seen.  Patient comfortable this plan.  No vomiting and no abdominal pain at this time.  Blood work reviewed reassuring normal lipase no signs pancreatitis, liver function and electrolytes unremarkable, normal white count and hemoglobin.  Compared to last blood work that was obtained when he was in the ER and that was also reassuring.  I feel follow-up with specialist would be helpful at this time.  Patient stable for discharge.  Patient has no abdominal tenderness to suggest appendicitis, bowel obstruction, diverticulitis or other intra-abdominal pathology.         Final Clinical Impression(s) / ED Diagnoses Final diagnoses:  Marijuana use  Nausea    Rx / DC Orders ED Discharge Orders          Ordered    metoCLOPramide (REGLAN) 10 MG tablet  Every  8 hours PRN        07/11/22 1239              Elnora Morrison, MD 07/11/22 1407

## 2022-07-11 NOTE — Discharge Instructions (Addendum)
Your blood work was reassuring.  Call gastroenterology for appointment.   Patient comfortable with plan.  Try Reglan for persistent nausea, You can take 25 mg of Benadryl with it to minimize side effects but no driving while taking..  Avoid marijuana as this may be related.  Follow-up with gastroenterologist for further discussion and possible endoscopy.  You can try reflux medications Prilosec is over-the-counter and you can take it for 2 weeks.  Return for new or worsening signs or symptoms.  Work note provided.

## 2022-07-11 NOTE — ED Notes (Signed)
ED Provider at bedside. 

## 2022-07-11 NOTE — ED Triage Notes (Signed)
C/O abdominal discomfort with feelings of having throw up; x 2 weeks; reports was seen a week ago for same issue and prescribed some medication but he could not afford it; LBM this morning and it was normal.

## 2024-04-15 ENCOUNTER — Other Ambulatory Visit: Payer: Self-pay

## 2024-04-15 ENCOUNTER — Emergency Department (HOSPITAL_BASED_OUTPATIENT_CLINIC_OR_DEPARTMENT_OTHER)
Admission: EM | Admit: 2024-04-15 | Discharge: 2024-04-15 | Disposition: A | Discharge status: Home / Self Care | Attending: Emergency Medicine | Admitting: Emergency Medicine

## 2024-04-15 DIAGNOSIS — H6501 Acute serous otitis media, right ear: Secondary | ICD-10-CM | POA: Diagnosis not present

## 2024-04-15 DIAGNOSIS — Z720 Tobacco use: Secondary | ICD-10-CM | POA: Diagnosis not present

## 2024-04-15 DIAGNOSIS — R0981 Nasal congestion: Secondary | ICD-10-CM | POA: Diagnosis present

## 2024-04-15 LAB — RESP PANEL BY RT-PCR (RSV, FLU A&B, COVID)  RVPGX2
Influenza A by PCR: NEGATIVE
Influenza B by PCR: NEGATIVE
Resp Syncytial Virus by PCR: NEGATIVE
SARS Coronavirus 2 by RT PCR: NEGATIVE

## 2024-04-15 NOTE — ED Notes (Signed)
 Dc instructions given, pt verbalized understanding. Out of ED with steady gait, not in visible distress.

## 2024-04-15 NOTE — ED Provider Notes (Signed)
 [facility] AT [facility] Provider Note   CSN: [id number] Arrival date & time: 04/15/24  2107     Patient presents with: Nasal Congestion and Ear Fullness   Paul Reilly is a 28 y.o. male.   28 year old male presenting with nasal congestion and right ear irritation.  He endorses several days of nasal congestion, he has not tried any over-the-counter medications for relief of the symptoms.  He also notes some irritation to his right ear, denies pain but does report occasional muffled hearing/fullness sensation.  Denies fever, cough, sore throat.   Ear Fullness       Prior to Admission medications   Medication Sig Start Date End Date Taking? Authorizing Provider  ALBUTEROL  IN Inhale into the lungs.    [provider]  amoxicillin  (AMOXIL ) 500 MG capsule Take 1 capsule (500 mg total) by mouth 2 (two) times daily. 12/27/20   Griselda Norris, MD  loperamide  (IMODIUM ) 2 MG capsule Take 1 capsule (2 mg total) by mouth 4 (four) times daily as needed for diarrhea or loose stools. 06/26/17   Olympia Gee, PA-C  metoCLOPramide  (REGLAN ) 10 MG tablet Take 1 tablet (10 mg total) by mouth every 8 (eight) hours as needed for nausea. 07/11/22   Tonia Chew, MD  naproxen  (NAPROSYN ) 500 MG tablet Take 1 tablet (500 mg total) by mouth 2 (two) times daily. 09/19/19   Aberman, Caroline C, PA-C  ondansetron  (ZOFRAN -ODT) 8 MG disintegrating tablet Take 1 tablet (8 mg total) by mouth every 8 (eight) hours as needed for nausea or vomiting. 06/27/22   Randol Simmonds, MD  pantoprazole  (PROTONIX ) 40 MG tablet Take 1 tablet (40 mg total) by mouth daily. 06/27/22   Randol Simmonds, MD  predniSONE  (DELTASONE ) 20 MG tablet Take 3 tablets (60 mg total) by mouth daily. Start the morning of 11/10/17 11/09/17   Ward, Josette SAILOR, DO    Allergies: Other and Shrimp [shellfish allergy]    Review of Systems  Updated Vital Signs BP (!) 148/98   Pulse 71   Temp 99.2 F (37.3 C)  (Oral)   Resp 15   Ht 5' 8 (1.727 m)   Wt 81.6 kg   SpO2 100%   BMI 27.37 kg/m   Physical Exam Vitals and nursing note reviewed.  HENT:     Head: Normocephalic.     Right Ear: Ear canal normal.     Left Ear: Tympanic membrane and ear canal normal.     Ears:     Comments: Serous effusion noted to R middle ear, no erythema/bulging, TM in tact     Nose: Congestion present.  Eyes:     Extraocular Movements: Extraocular movements intact.  Cardiovascular:     Rate and Rhythm: Normal rate.  Pulmonary:     Effort: Pulmonary effort is normal.  Musculoskeletal:     Cervical back: Normal range of motion.     Comments: Moves all extremities spontaneously without difficulty  Skin:    General: Skin is warm and dry.  Neurological:     Mental Status: He is alert and oriented to person, place, and time.     (all labs ordered are listed, but only abnormal results are displayed) Labs Reviewed  RESP PANEL BY RT-PCR (RSV, FLU A&B, COVID)  [id number]    EKG: None  Radiology: No results found.   Procedures   Medications Ordered in the ED - No data to display  Medical Decision Making This patient presents to the ED for concern of nasal congestion and ear irritation/fullness, this involves an extensive number of treatment options, and is a complaint that carries with it a high risk of complications and morbidity.  The differential diagnosis includes viral URI, COVID/flu/RSV, otitis media, otitis externa, serous effusion of middle ear    Lab Tests:  I Ordered, and personally interpreted labs.  The pertinent results include: COVID/flu/RSV negative  Cardiac Monitoring: / EKG:  The patient was maintained on a cardiac monitor.  I personally viewed and interpreted the cardiac monitored which showed an underlying rhythm of: NSR   Social Determinants of Health:  Tobacco use   Test / Admission - Considered:  Physical exam is notable as above.   Patient does have nasal congestion and a right serous effusion behind his TM, no erythema/bulging, no suspicion for acute otitis media or otitis externa at this time, symptoms today are most of a viral illness.  Vitals are reassuring, patient is afebrile and without tachycardia.  I recommend that he continue over-the-counter medications for relief of his symptoms, like Flonase or Sudafed for relief of congestion, and over-the-counter antihistamines like Zyrtec or Claritin which may help relieve the symptoms related to his serous effusion.  I advised that typically a serous effusion will resolve on its own over time, however I do recommend that he discuss this with his primary care provider if his symptoms persist.  He voiced understanding is in agreement with this plan, return precautions discussed, he is appropriate for discharge at this time.          Final diagnoses:  Nasal congestion  Right acute serous otitis media, recurrence not specified    ED Discharge Orders     None          Glendia Rocky SAILOR, [place] 04/15/24 2340    Geraldene Glendia, MD 04/18/24 1223

## 2024-04-15 NOTE — Discharge Instructions (Signed)
 Continue over-the-counter remedies, like Flonase or Sudafed, for relief of your nasal congestion.  You were found to have some fluid buildup, or effusion, behind your right eardrum.  You do not have any signs of an ear infection and you do not require treatment with antibiotics for this.  Start an over-the-counter antihistamine, like Zyrtec or Claritin, as this may help alleviate the symptoms.  Return to the emergency department if your symptoms worsen.  Follow-up with your PCP as needed.
